# Patient Record
Sex: Female | Born: 1965 | Race: Black or African American | Hispanic: No | Marital: Single | Smoking: Never smoker
Health system: Southern US, Community
[De-identification: ages and names within clinical notes are randomized; demographics above are authoritative.]

## PROBLEM LIST (undated history)

## (undated) DIAGNOSIS — E785 Hyperlipidemia, unspecified: Secondary | ICD-10-CM

## (undated) DIAGNOSIS — G4730 Sleep apnea, unspecified: Secondary | ICD-10-CM

## (undated) DIAGNOSIS — D649 Anemia, unspecified: Secondary | ICD-10-CM

## (undated) DIAGNOSIS — I10 Essential (primary) hypertension: Secondary | ICD-10-CM

## (undated) HISTORY — DX: Hyperlipidemia, unspecified: E78.5

## (undated) HISTORY — PX: ROTATOR CUFF REPAIR: SHX139

## (undated) HISTORY — DX: Anemia, unspecified: D64.9

## (undated) HISTORY — PX: TUBAL LIGATION: SHX77

## (undated) HISTORY — DX: Sleep apnea, unspecified: G47.30

## (undated) HISTORY — DX: Essential (primary) hypertension: I10

---

## 2003-11-03 ENCOUNTER — Emergency Department (HOSPITAL_COMMUNITY): Admission: EM | Admit: 2003-11-03 | Discharge: 2003-11-03 | Payer: Self-pay | Admitting: Emergency Medicine

## 2005-03-20 ENCOUNTER — Emergency Department (HOSPITAL_COMMUNITY): Admission: EM | Admit: 2005-03-20 | Discharge: 2005-03-20 | Payer: Self-pay | Admitting: Emergency Medicine

## 2005-03-21 ENCOUNTER — Emergency Department (HOSPITAL_COMMUNITY): Admission: EM | Admit: 2005-03-21 | Discharge: 2005-03-21 | Payer: Self-pay | Admitting: Emergency Medicine

## 2005-03-25 ENCOUNTER — Emergency Department: Payer: Self-pay | Admitting: Internal Medicine

## 2005-10-05 ENCOUNTER — Emergency Department: Payer: Self-pay | Admitting: Unknown Physician Specialty

## 2007-02-17 ENCOUNTER — Emergency Department: Payer: Self-pay | Admitting: Emergency Medicine

## 2007-02-23 ENCOUNTER — Emergency Department: Payer: Self-pay | Admitting: Emergency Medicine

## 2007-02-26 ENCOUNTER — Emergency Department: Payer: Self-pay | Admitting: Emergency Medicine

## 2007-03-06 ENCOUNTER — Emergency Department: Payer: Self-pay | Admitting: Emergency Medicine

## 2007-03-08 ENCOUNTER — Emergency Department: Payer: Self-pay | Admitting: General Practice

## 2007-11-20 ENCOUNTER — Emergency Department: Payer: Self-pay | Admitting: Emergency Medicine

## 2008-07-08 ENCOUNTER — Emergency Department: Payer: Self-pay | Admitting: Emergency Medicine

## 2008-07-30 ENCOUNTER — Emergency Department: Payer: Self-pay | Admitting: Emergency Medicine

## 2008-09-03 ENCOUNTER — Emergency Department: Payer: Self-pay | Admitting: Emergency Medicine

## 2008-09-09 ENCOUNTER — Ambulatory Visit: Payer: Self-pay

## 2008-09-09 ENCOUNTER — Other Ambulatory Visit: Payer: Self-pay

## 2008-09-10 ENCOUNTER — Ambulatory Visit: Payer: Self-pay | Admitting: Cardiovascular Disease

## 2008-09-11 ENCOUNTER — Ambulatory Visit: Payer: Self-pay

## 2008-10-03 ENCOUNTER — Emergency Department: Payer: Self-pay | Admitting: Emergency Medicine

## 2008-11-09 ENCOUNTER — Emergency Department: Payer: Self-pay

## 2008-11-27 ENCOUNTER — Emergency Department: Payer: Self-pay | Admitting: Emergency Medicine

## 2008-11-29 ENCOUNTER — Emergency Department: Payer: Self-pay | Admitting: Internal Medicine

## 2009-03-22 ENCOUNTER — Emergency Department: Payer: Self-pay | Admitting: Emergency Medicine

## 2009-05-23 ENCOUNTER — Emergency Department: Payer: Self-pay | Admitting: Internal Medicine

## 2009-05-25 ENCOUNTER — Emergency Department: Payer: Self-pay | Admitting: Emergency Medicine

## 2009-08-12 ENCOUNTER — Emergency Department: Payer: Self-pay | Admitting: Emergency Medicine

## 2009-12-05 ENCOUNTER — Emergency Department (HOSPITAL_COMMUNITY): Admission: EM | Admit: 2009-12-05 | Discharge: 2009-12-05 | Payer: Self-pay | Admitting: Family Medicine

## 2010-09-11 ENCOUNTER — Emergency Department: Payer: Self-pay | Admitting: Unknown Physician Specialty

## 2010-09-16 ENCOUNTER — Emergency Department: Payer: Self-pay | Admitting: Emergency Medicine

## 2010-10-17 ENCOUNTER — Emergency Department: Payer: Self-pay | Admitting: Emergency Medicine

## 2011-01-13 ENCOUNTER — Observation Stay: Payer: Self-pay | Admitting: Specialist

## 2011-02-04 ENCOUNTER — Emergency Department: Payer: Self-pay | Admitting: Emergency Medicine

## 2011-02-26 ENCOUNTER — Emergency Department: Payer: Self-pay | Admitting: Internal Medicine

## 2011-03-21 LAB — POCT URINALYSIS DIP (DEVICE)
Glucose, UA: NEGATIVE mg/dL
Nitrite: NEGATIVE
Urobilinogen, UA: 2 mg/dL — ABNORMAL HIGH (ref 0.0–1.0)

## 2011-03-21 LAB — POCT PREGNANCY, URINE: Preg Test, Ur: NEGATIVE

## 2011-04-01 ENCOUNTER — Emergency Department (HOSPITAL_COMMUNITY)
Admission: EM | Admit: 2011-04-01 | Discharge: 2011-04-02 | Disposition: A | Discharge status: Home / Self Care | Payer: Self-pay | Attending: Emergency Medicine | Admitting: Emergency Medicine

## 2011-04-01 ENCOUNTER — Emergency Department (HOSPITAL_COMMUNITY): Payer: Self-pay

## 2011-04-01 DIAGNOSIS — E785 Hyperlipidemia, unspecified: Secondary | ICD-10-CM | POA: Insufficient documentation

## 2011-04-01 DIAGNOSIS — E669 Obesity, unspecified: Secondary | ICD-10-CM | POA: Insufficient documentation

## 2011-04-01 DIAGNOSIS — I10 Essential (primary) hypertension: Secondary | ICD-10-CM | POA: Insufficient documentation

## 2011-04-01 DIAGNOSIS — N83209 Unspecified ovarian cyst, unspecified side: Secondary | ICD-10-CM | POA: Insufficient documentation

## 2011-04-01 DIAGNOSIS — R10819 Abdominal tenderness, unspecified site: Secondary | ICD-10-CM | POA: Insufficient documentation

## 2011-04-01 DIAGNOSIS — N949 Unspecified condition associated with female genital organs and menstrual cycle: Secondary | ICD-10-CM | POA: Insufficient documentation

## 2011-04-01 DIAGNOSIS — N920 Excessive and frequent menstruation with regular cycle: Secondary | ICD-10-CM | POA: Insufficient documentation

## 2011-04-01 DIAGNOSIS — Z79899 Other long term (current) drug therapy: Secondary | ICD-10-CM | POA: Insufficient documentation

## 2011-04-01 DIAGNOSIS — R109 Unspecified abdominal pain: Secondary | ICD-10-CM | POA: Insufficient documentation

## 2011-04-01 LAB — CBC
HCT: 34 % — ABNORMAL LOW (ref 36.0–46.0)
Hemoglobin: 10.8 g/dL — ABNORMAL LOW (ref 12.0–15.0)
MCH: 22.5 pg — ABNORMAL LOW (ref 26.0–34.0)
MCHC: 31.8 g/dL (ref 30.0–36.0)
MCV: 70.8 fL — ABNORMAL LOW (ref 78.0–100.0)

## 2011-04-01 LAB — URINALYSIS, ROUTINE W REFLEX MICROSCOPIC
Nitrite: NEGATIVE
Protein, ur: NEGATIVE mg/dL
Specific Gravity, Urine: 1.033 — ABNORMAL HIGH (ref 1.005–1.030)

## 2011-04-01 LAB — DIFFERENTIAL
Basophils Absolute: 0 10*3/uL (ref 0.0–0.1)
Basophils Relative: 0 % (ref 0–1)
Eosinophils Absolute: 0.1 10*3/uL (ref 0.0–0.7)
Eosinophils Relative: 1 % (ref 0–5)
Lymphocytes Relative: 40 % (ref 12–46)
Neutro Abs: 4.8 10*3/uL (ref 1.7–7.7)

## 2011-04-01 LAB — URINE MICROSCOPIC-ADD ON

## 2011-04-01 LAB — WET PREP, GENITAL: Trich, Wet Prep: NONE SEEN

## 2011-04-19 ENCOUNTER — Ambulatory Visit: Payer: Self-pay | Admitting: Family Medicine

## 2011-05-04 ENCOUNTER — Other Ambulatory Visit: Payer: Self-pay | Admitting: Obstetrics & Gynecology

## 2011-05-04 ENCOUNTER — Encounter (INDEPENDENT_AMBULATORY_CARE_PROVIDER_SITE_OTHER): Payer: Self-pay | Admitting: Obstetrics & Gynecology

## 2011-05-04 DIAGNOSIS — N83209 Unspecified ovarian cyst, unspecified side: Secondary | ICD-10-CM

## 2011-05-04 DIAGNOSIS — N920 Excessive and frequent menstruation with regular cycle: Secondary | ICD-10-CM

## 2011-05-05 ENCOUNTER — Ambulatory Visit: Payer: Self-pay | Admitting: Family Medicine

## 2011-05-05 NOTE — Group Therapy Note (Signed)
[name], [name]                  ACCOUNT NO.:  [id number]  MEDICAL RECORD NO.:  [id number]           PATIENT TYPE:  A  LOCATION:  [facility]                   FACILITY:  [facility]  PHYSICIAN:  Scheryl Darter, MD       DATE OF BIRTH:  [birth year]  DATE OF SERVICE:  05/04/2011                                 CLINIC NOTE  The patient comes today after an episode of menorrhagia in April.  The patient is a 45 year old black female, gravida 6, para 6-0-0-6, last menstrual period began about March 18, 2011, and continued until April 07, 2011.  Bleeding became very heavy and she was soaking pads in less than an hour and having to use towels because of the bleeding.  She presents to the emergency room at [facility] on April 01, 2011.  She says prior to this she was having fairly regular periods that lasted about 3 days.  About 3 years ago, she had menorrhagia and she was seen by a gynecologist in [place].  She had a D and C performed.  Results were normal.  PAST MEDICAL HISTORY: 1. Type 2 diabetes. 2. Hypertension. 3. Hypercholesterolemia. 4. Obesity.  PAST SURGICAL HISTORY:  Tubal ligation.  SOCIAL HISTORY:  The patient is single.  She is unemployed.  She denies alcohol, tobacco or drug use.  FAMILY HISTORY:  No diabetes, heart disease or cancer in any close relatives.  REVIEW OF SYSTEMS:  The patient does have some weakness and fatigue. She gets headaches.  She occasionally has lower abdominal pain.  This occurs about 2 times a week and lasts for a couple minutes.  She also has a vaginal discharge with no odor.  She also notes frequent urination, which she thinks may be due to her fluid pill.  MEDICATIONS: 1. Metformin 500 mg p.o. b.i.d. 2. Lisinopril/hydrochlorothiazide 10/12.5 one p.o. daily. 3. Lipitor one p.o. daily.  ALLERGY:  To SULFA.  No latex allergy.  PHYSICAL EXAMINATION:  GENERAL:  The patient is in no acute distress. VITAL SIGNS:  Her weight is 237.7 pounds, height  5 feet 4 inches, blood pressure 130/84, pulse 86 and temperature 98.5.  Her affect is normal. ABDOMEN:  Obese, soft and nontender.  No mass. EXTREMITIES:  No swelling or deformity. PELVIC:  External genitalia, vagina and cervix are notable for a slight pinkish discharge with an odor.  Cervix is quite anterior.  Cervix was normal and a STD probe was obtained.  Wet prep was also done.  Uterus is retroverted and appears to be normal size.  No adnexal masses or tenderness.  Ultrasound that was performed on April 01, 2011, showed a 2.3 x 2.2 cm focus on the right ovary, question complicated cyst, endometrium was 3-mm thick with no fluid.  Left ovary was not identified.  Uterus is 9.7 cm x 5.9 cm x 6.8 cm with a probable small fibroid 1.8 x 2.0 cm.  IMPRESSION: 1. History of episode of menometrorrhagia. 2. Abdominal pain. 3. Abnormal discharge most likely representing a bacterial vaginosis.  PLAN:  The patient has close followup with her internist and will see her doctor soon.  She  states that in addition to abdominal pain, she has chronic constipation for which she takes Epson salts.  She may have 3 bowel movements without using laxative every month.  Her last bowel movement was 5 days ago.  She should discuss referral to a gastroenterologist with her internist.  Because of ovarian cyst, she could follow up with an ultrasound in about 2 months.  She should keep a menstrual calendar.  She will return in 2 months.  If she continues to have problems with heavy bleeding, she will be a candidate for a Mirena. She is also a candidate for endometrial ablation.     Scheryl Darter, MD    JA/MEDQ  D:  05/04/2011  T:  05/05/2011  Job:  [id number]

## 2011-07-06 ENCOUNTER — Ambulatory Visit (HOSPITAL_COMMUNITY)
Admission: RE | Admit: 2011-07-06 | Discharge: 2011-07-06 | Disposition: A | Discharge status: Home / Self Care | Payer: Self-pay | Source: Ambulatory Visit | Attending: Obstetrics & Gynecology | Admitting: Obstetrics & Gynecology

## 2011-07-06 DIAGNOSIS — N83209 Unspecified ovarian cyst, unspecified side: Secondary | ICD-10-CM

## 2011-07-06 DIAGNOSIS — D259 Leiomyoma of uterus, unspecified: Secondary | ICD-10-CM | POA: Insufficient documentation

## 2011-07-22 ENCOUNTER — Emergency Department: Payer: Self-pay | Admitting: *Deleted

## 2011-07-27 ENCOUNTER — Encounter: Payer: Self-pay | Admitting: *Deleted

## 2011-07-27 DIAGNOSIS — N920 Excessive and frequent menstruation with regular cycle: Secondary | ICD-10-CM

## 2011-07-27 DIAGNOSIS — E7800 Pure hypercholesterolemia, unspecified: Secondary | ICD-10-CM

## 2011-07-27 DIAGNOSIS — E669 Obesity, unspecified: Secondary | ICD-10-CM

## 2011-07-27 DIAGNOSIS — I10 Essential (primary) hypertension: Secondary | ICD-10-CM

## 2011-07-27 DIAGNOSIS — E119 Type 2 diabetes mellitus without complications: Secondary | ICD-10-CM | POA: Insufficient documentation

## 2011-07-28 ENCOUNTER — Ambulatory Visit: Payer: Self-pay | Admitting: Family Medicine

## 2011-08-16 ENCOUNTER — Emergency Department: Payer: Self-pay | Admitting: *Deleted

## 2011-11-21 ENCOUNTER — Encounter (HOSPITAL_COMMUNITY): Payer: Self-pay | Admitting: Emergency Medicine

## 2011-11-21 ENCOUNTER — Emergency Department (HOSPITAL_COMMUNITY)
Admission: EM | Admit: 2011-11-21 | Discharge: 2011-11-22 | Discharge status: Against Medical Advice | Payer: Self-pay | Attending: Emergency Medicine | Admitting: Emergency Medicine

## 2011-11-21 DIAGNOSIS — Z0389 Encounter for observation for other suspected diseases and conditions ruled out: Secondary | ICD-10-CM | POA: Insufficient documentation
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
## 2011-11-22 ENCOUNTER — Emergency Department (HOSPITAL_COMMUNITY): Payer: No Typology Code available for payment source

## 2011-11-22 ENCOUNTER — Encounter (HOSPITAL_COMMUNITY): Payer: Self-pay | Admitting: *Deleted

## 2011-11-22 ENCOUNTER — Emergency Department (HOSPITAL_COMMUNITY)
Admission: EM | Admit: 2011-11-22 | Discharge: 2011-11-23 | Disposition: A | Discharge status: Home / Self Care | Payer: No Typology Code available for payment source | Attending: Emergency Medicine | Admitting: Emergency Medicine

## 2011-11-22 DIAGNOSIS — Y92410 Unspecified street and highway as the place of occurrence of the external cause: Secondary | ICD-10-CM | POA: Insufficient documentation

## 2011-11-22 DIAGNOSIS — R0789 Other chest pain: Secondary | ICD-10-CM

## 2011-11-22 DIAGNOSIS — Z041 Encounter for examination and observation following transport accident: Secondary | ICD-10-CM

## 2011-11-22 DIAGNOSIS — R079 Chest pain, unspecified: Secondary | ICD-10-CM | POA: Insufficient documentation

## 2011-11-22 DIAGNOSIS — E119 Type 2 diabetes mellitus without complications: Secondary | ICD-10-CM | POA: Insufficient documentation

## 2011-11-22 DIAGNOSIS — Z043 Encounter for examination and observation following other accident: Secondary | ICD-10-CM | POA: Insufficient documentation

## 2011-11-22 DIAGNOSIS — R51 Headache: Secondary | ICD-10-CM | POA: Insufficient documentation

## 2011-11-22 DIAGNOSIS — M549 Dorsalgia, unspecified: Secondary | ICD-10-CM | POA: Insufficient documentation

## 2011-11-22 DIAGNOSIS — R071 Chest pain on breathing: Secondary | ICD-10-CM | POA: Insufficient documentation

## 2011-11-22 DIAGNOSIS — I10 Essential (primary) hypertension: Secondary | ICD-10-CM | POA: Insufficient documentation
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
## 2011-11-22 NOTE — ED Notes (Signed)
Called patient 3 times and NO ANSWER. 

## 2011-11-23 MED ORDER — CYCLOBENZAPRINE HCL 10 MG PO TABS
10.0000 mg | ORAL_TABLET | Freq: Once | ORAL | Status: AC
  Administered 2011-11-23: 10 mg via ORAL
  Filled 2011-11-23: qty 1

## 2011-11-23 MED ORDER — TRAMADOL HCL 50 MG PO TABS
100.0000 mg | ORAL_TABLET | Freq: Once | ORAL | Status: AC
  Administered 2011-11-23: 100 mg via ORAL
  Filled 2011-11-23: qty 2

## 2011-11-23 MED ORDER — CYCLOBENZAPRINE HCL 10 MG PO TABS: 10.0000 mg | ORAL_TABLET | Freq: Two times a day (BID) | ORAL | Status: AC | PRN

## 2011-11-23 MED ORDER — TRAMADOL HCL 50 MG PO TABS: 100.0000 mg | ORAL_TABLET | Freq: Four times a day (QID) | ORAL | Status: AC | PRN

## 2011-11-23 NOTE — ED Provider Notes (Signed)
Medical screening examination/treatment/procedure(s) were performed by non-physician practitioner and as supervising physician I was immediately available for consultation/collaboration.  Cornella Emmer, MD 11/23/11 0052 
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
## 2012-06-07 ENCOUNTER — Ambulatory Visit: Payer: Self-pay | Admitting: Family Medicine

## 2012-07-16 IMAGING — CR DG CHEST 1V PORT
1 series · 1 of 1 positions shown · non-contrast
Comparison: none

REASON FOR EXAM: chest pain
COMMENTS:

PROCEDURE:     DXR - DXR PORTABLE CHEST SINGLE VIEW  - January 13, 2011  [DATE]
RESULT:     The lung fields are clear. The heart, mediastinal and osseous
structures show no significant abnormalities.

[view not recorded]
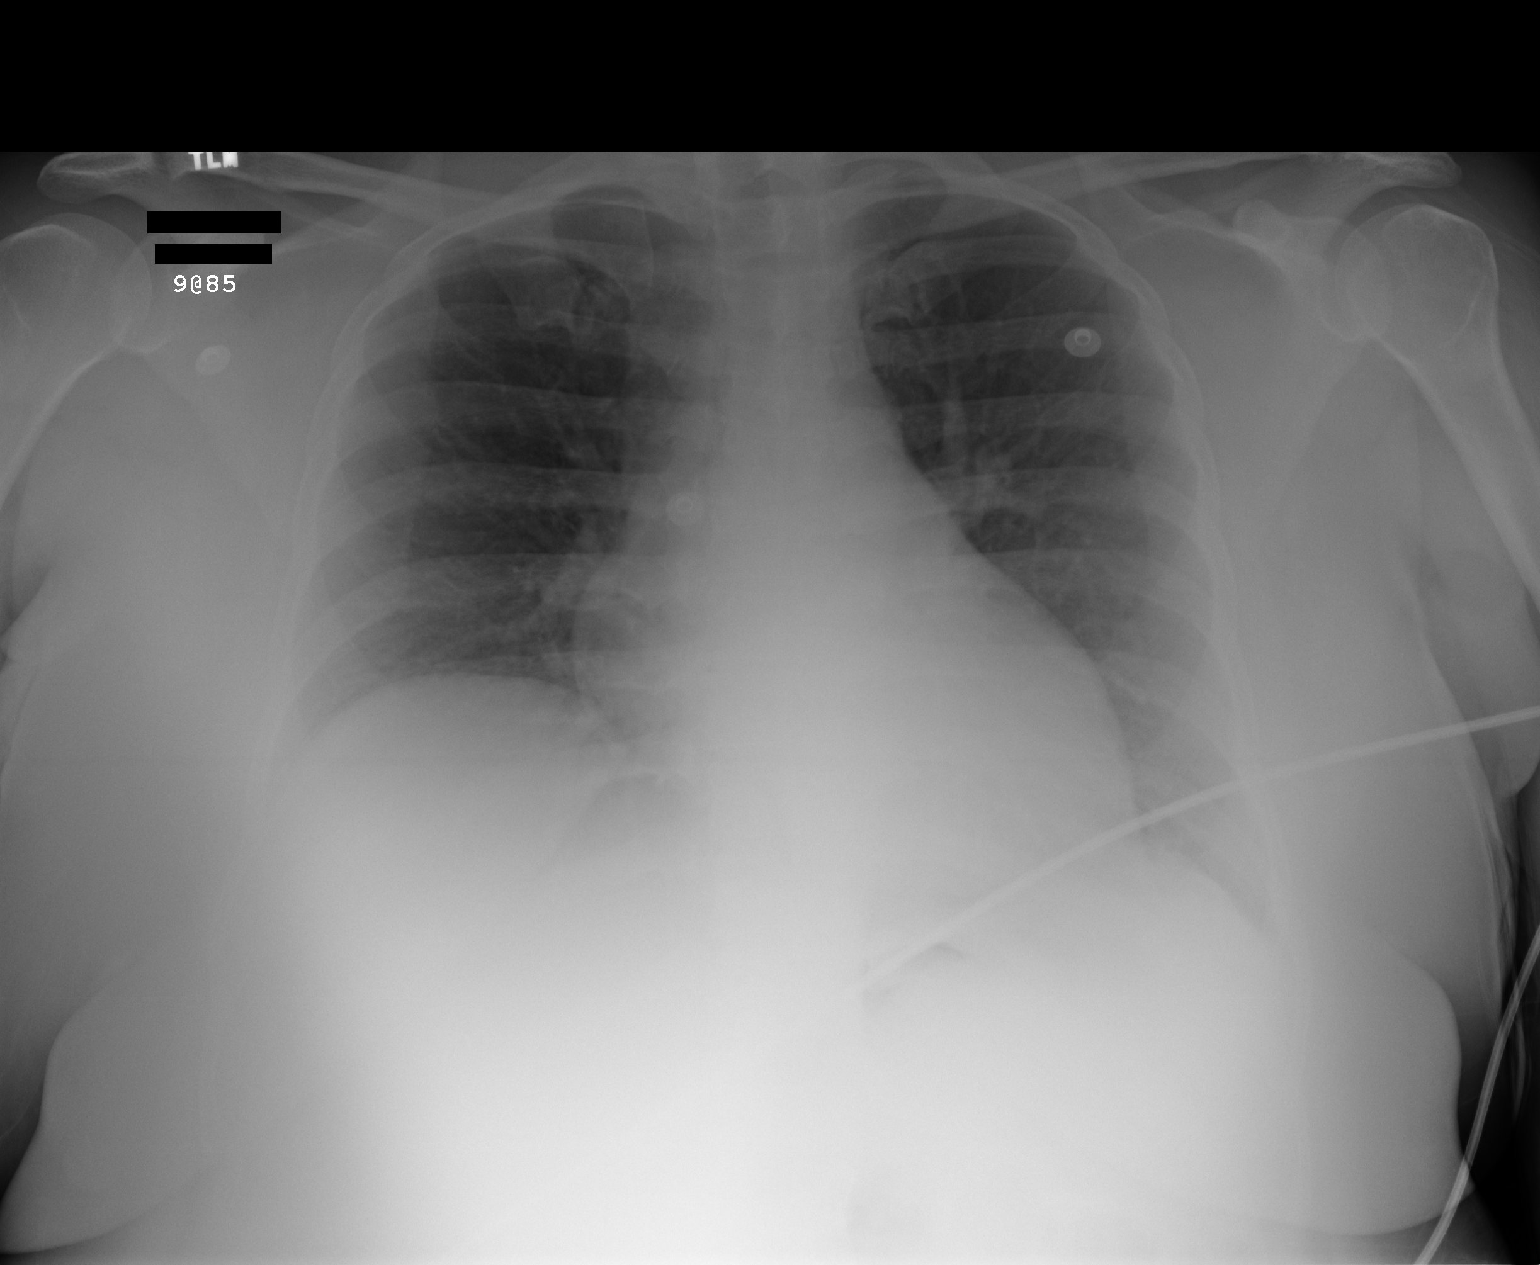

[1 of 1 positions shown; findings below may reference images not displayed]

IMPRESSION: No acute changes are identified.

## 2013-04-25 ENCOUNTER — Ambulatory Visit: Payer: Self-pay | Admitting: Obstetrics & Gynecology

## 2013-05-06 ENCOUNTER — Ambulatory Visit: Payer: Self-pay | Admitting: Obstetrics & Gynecology

## 2013-06-12 ENCOUNTER — Emergency Department: Payer: Self-pay | Admitting: Internal Medicine

## 2013-06-12 LAB — URINALYSIS, COMPLETE
Bilirubin,UR: NEGATIVE
Blood: NEGATIVE
Ketone: NEGATIVE
Nitrite: NEGATIVE
Protein: NEGATIVE
RBC,UR: 11 /HPF (ref 0–5)
Specific Gravity: 1.028 (ref 1.003–1.030)
Squamous Epithelial: 35
WBC UR: 6 /HPF (ref 0–5)

## 2013-06-13 LAB — URINE CULTURE

## 2013-12-10 ENCOUNTER — Emergency Department: Payer: Self-pay | Admitting: Emergency Medicine

## 2013-12-10 LAB — CBC
HCT: 43.3 % (ref 35.0–47.0)
Platelet: 214 10*3/uL (ref 150–440)

## 2013-12-10 LAB — COMPREHENSIVE METABOLIC PANEL
Albumin: 4.3 g/dL (ref 3.4–5.0)
Anion Gap: 5 — ABNORMAL LOW (ref 7–16)
BUN: 18 mg/dL (ref 7–18)
Bilirubin,Total: 0.7 mg/dL (ref 0.2–1.0)
Calcium, Total: 9.7 mg/dL (ref 8.5–10.1)
Co2: 28 mmol/L (ref 21–32)
EGFR (African American): >60
Osmolality: 273 (ref 275–301)
Sodium: 136 mmol/L (ref 136–145)
Total Protein: 8.3 g/dL — ABNORMAL HIGH (ref 6.4–8.2)

## 2013-12-10 LAB — TROPONIN I
Troponin-I: <0.02 ng/mL
Troponin-I: <0.02 ng/mL

## 2014-02-14 ENCOUNTER — Emergency Department: Payer: Self-pay | Admitting: Emergency Medicine

## 2014-02-16 ENCOUNTER — Emergency Department: Payer: Self-pay | Admitting: Internal Medicine

## 2014-02-16 LAB — CBC WITH DIFFERENTIAL/PLATELET
BASOS PCT: 0.5 %
Basophil #: 0 10*3/uL (ref 0.0–0.1)
EOS PCT: 3 %
Eosinophil #: 0.2 10*3/uL (ref 0.0–0.7)
HCT: 40.9 % (ref 35.0–47.0)
HGB: 13 g/dL (ref 12.0–16.0)
Lymphocyte #: 2.5 10*3/uL (ref 1.0–3.6)
Lymphocyte %: 34.2 %
MCH: 23.1 pg — ABNORMAL LOW (ref 26.0–34.0)
MCHC: 31.7 g/dL — ABNORMAL LOW (ref 32.0–36.0)
MCV: 73 fL — AB (ref 80–100)
MONO ABS: 0.6 x10 3/mm (ref 0.2–0.9)
Monocyte %: 7.8 %
NEUTROS ABS: 4 10*3/uL (ref 1.4–6.5)
Neutrophil %: 54.5 %
PLATELETS: 208 10*3/uL (ref 150–440)
RBC: 5.62 10*6/uL — ABNORMAL HIGH (ref 3.80–5.20)
RDW: 16 % — AB (ref 11.5–14.5)
WBC: 7.3 10*3/uL (ref 3.6–11.0)

## 2014-02-16 LAB — COMPREHENSIVE METABOLIC PANEL
ALBUMIN: 3.8 g/dL (ref 3.4–5.0)
AST: 13 U/L — AB (ref 15–37)
Alkaline Phosphatase: 102 U/L
Anion Gap: 3 — ABNORMAL LOW (ref 7–16)
BUN: 14 mg/dL (ref 7–18)
Bilirubin,Total: 0.7 mg/dL (ref 0.2–1.0)
Calcium, Total: 9.6 mg/dL (ref 8.5–10.1)
Chloride: 103 mmol/L (ref 98–107)
Co2: 32 mmol/L (ref 21–32)
Creatinine: 0.74 mg/dL (ref 0.60–1.30)
GLUCOSE: 91 mg/dL (ref 65–99)
Osmolality: 276 (ref 275–301)
POTASSIUM: 3.8 mmol/L (ref 3.5–5.1)
SGPT (ALT): 23 U/L (ref 12–78)
Sodium: 138 mmol/L (ref 136–145)
TOTAL PROTEIN: 8 g/dL (ref 6.4–8.2)

## 2014-10-20 ENCOUNTER — Encounter (HOSPITAL_COMMUNITY): Payer: Self-pay | Admitting: *Deleted

## 2014-10-21 ENCOUNTER — Emergency Department: Payer: Self-pay | Admitting: Emergency Medicine

## 2014-12-09 ENCOUNTER — Emergency Department: Payer: Self-pay | Admitting: Emergency Medicine

## 2014-12-22 ENCOUNTER — Emergency Department: Payer: Self-pay | Admitting: Emergency Medicine

## 2015-03-30 DIAGNOSIS — G5603 Carpal tunnel syndrome, bilateral upper limbs: Secondary | ICD-10-CM | POA: Insufficient documentation

## 2015-05-31 ENCOUNTER — Encounter: Payer: Self-pay | Admitting: *Deleted

## 2015-05-31 ENCOUNTER — Emergency Department
Admission: EM | Admit: 2015-05-31 | Discharge: 2015-05-31 | Disposition: A | Discharge status: Home / Self Care | Payer: Self-pay | Attending: Emergency Medicine | Admitting: Emergency Medicine

## 2015-05-31 DIAGNOSIS — B372 Candidiasis of skin and nail: Secondary | ICD-10-CM

## 2015-05-31 DIAGNOSIS — I10 Essential (primary) hypertension: Secondary | ICD-10-CM | POA: Insufficient documentation

## 2015-05-31 DIAGNOSIS — Z79899 Other long term (current) drug therapy: Secondary | ICD-10-CM | POA: Insufficient documentation

## 2015-05-31 DIAGNOSIS — R21 Rash and other nonspecific skin eruption: Secondary | ICD-10-CM | POA: Insufficient documentation

## 2015-05-31 DIAGNOSIS — E119 Type 2 diabetes mellitus without complications: Secondary | ICD-10-CM | POA: Insufficient documentation

## 2015-05-31 DIAGNOSIS — B379 Candidiasis, unspecified: Secondary | ICD-10-CM | POA: Insufficient documentation

## 2015-05-31 MED ORDER — PREDNISONE 10 MG PO TABS: ORAL_TABLET | ORAL | Status: DC

## 2015-05-31 MED ORDER — NYSTATIN 100000 UNIT/GM EX POWD: CUTANEOUS | Status: DC

## 2015-05-31 NOTE — ED Notes (Signed)
NAD noted at time of D/C. Pt denies questions or concerns. Pt ambulatory to the lobby at this time.  

## 2015-05-31 NOTE — ED Notes (Signed)
Pt c/o recurrent rash that is under breasts, chest, and face. Pt states she has this rash every 4-5 months. Pt was rx'd diflucan and states that does not work to clear up rash, only prednisone does. Pt is diabetic and aware of how it will affect her blood sugar.

## 2015-05-31 NOTE — ED Provider Notes (Signed)
[facility] Emergency Department Provider Note  ____________________________________________  Time seen: Approximately 7:14 AM  I have reviewed the triage vital signs and the nursing notes.   HISTORY  Chief Complaint Rash   HPI [name] is a 49 y.o. female presents to the ER for the complaint of rash. Patient has a rash present for 5 days. However states that this is the same rash that she gets intermittently every 4-5 months for approximately last 3 years. Patient states the rash starts underneath her breasts she continues to scratch and that it progresses upwards to her chest and occasionally her face. Patient states that the current rash is underneath her breast to her chest as well as her neck and face. Patient denies rash elsewhere. Denies pain. States rash is itchy. Denies facial swelling, mouth swelling oral swelling,, difficult eating. Denies fever, shortness of breath, chest pain, abdominal pain, other complaints.  Eyes changes and foods, medicines, as lotions, detergents, contacts, changes and home or other changes.  States she she was seen by her primary care physician on Friday. States that PCP felt that this was a yeast rash and was given oral Diflucan. States that she has taken or Diflucan and the rash is unresolved. Patient states that she has had this in the past and she feels that she needs prednisone.  Past Medical History  Diagnosis Date  . Anemia   . Hyperlipidemia   . Hypertension   . Diabetes mellitus     adult onset at age of 87  . Sleep apnea     Patient Active Problem List   Diagnosis Date Noted  . Menorrhagia 07/27/2011  . Diabetes mellitus type II 07/27/2011  . Obesity 07/27/2011  . Hypertension 07/27/2011  . Hypercholesteremia 07/27/2011    Past Surgical History  Procedure Laterality Date  . Tubal ligation      Current Outpatient Rx  Name  Route  Sig  Dispense  Refill  . diphenhydramine-acetaminophen (TYLENOL PM  EXTRA STRENGTH) 25-500 MG TABS   Oral   Take 1 tablet by mouth at bedtime as needed.           . fluconazole (DIFLUCAN) 100 MG tablet   Oral   Take 100 mg by mouth daily.         . hydrOXYzine (ATARAX/VISTARIL) 25 MG tablet   Oral   Take 25 mg by mouth every 6 (six) hours as needed for itching.         Marland Kitchen lisinopril-hydrochlorothiazide (PRINZIDE,ZESTORETIC) 10-12.5 MG per tablet   Oral   Take 1 tablet by mouth daily.           . metFORMIN (GLUCOPHAGE) 500 MG tablet   Oral   Take 500 mg by mouth 2 (two) times daily with a meal.           . metoprolol tartrate (LOPRESSOR) 25 MG tablet   Oral   Take 25 mg by mouth 2 (two) times daily.         Marland Kitchen atorvastatin (LIPITOR) 20 MG tablet   Oral   Take 20 mg by mouth daily.             Allergies Sulfa antibiotics  History reviewed. No pertinent family history.  Social History History  Substance Use Topics  . Smoking status: Never Smoker   . Smokeless tobacco: Never Used  . Alcohol Use: Yes     Comment: occasional    Review of Systems Constitutional: No fever/chills Eyes: No visual  changes. ENT: No sore throat. Cardiovascular: Denies chest pain. Respiratory: Denies shortness of breath. Gastrointestinal: No abdominal pain.  No nausea, no vomiting.  No diarrhea.  No constipation. Genitourinary: Negative for dysuria. Musculoskeletal: Negative for back pain. Skin: Positive for rash. Neurological: Negative for headaches, focal weakness or numbness.  10-point ROS otherwise negative.  ____________________________________________   PHYSICAL EXAM:  VITAL SIGNS: ED Triage Vitals  Enc Vitals Group     BP 05/31/15 0554 124/76 mmHg     Pulse Rate 05/31/15 0554 63     Resp 05/31/15 0554 18     Temp 05/31/15 0554 97.8 F (36.6 C)     Temp Source 05/31/15 0554 Oral     SpO2 05/31/15 0554 96 %     Weight 05/31/15 0554 238 lb (107.956 kg)     Height 05/31/15 0554 5\' 4"  (1.626 m)     Head Cir --      Peak Flow  --      Pain Score 05/31/15 0600 6     Pain Loc --      Pain Edu? --      Excl. in [place]? --     Constitutional: Alert and oriented. Well appearing and in no acute distress. Eyes: Conjunctivae are normal. PERRL. EOMI. Head: Atraumatic. Nose: No congestion/rhinnorhea. Mouth/Throat: Mucous membranes are moist.  Oropharynx non-erythematous.NO facial swelling, no oral swelling or lip swelling. Neck: No stridor.  No cervical spine tenderness to palpation. Hematological/Lymphatic/Immunilogical: No cervical lymphadenopathy. Cardiovascular: Normal rate, regular rhythm. Grossly normal heart sounds.  Good peripheral circulation. Respiratory: Normal respiratory effort.  No retractions. Lungs CTAB. Gastrointestinal: Soft and nontender. No distention. Musculoskeletal: No lower extremity tenderness nor edema.  No joint effusions. Neurologic:  Normal speech and language. No gross focal neurologic deficits are appreciated. Speech is normal. No gait instability. Skin:  Skin is warm, dry and intact. No rash noted. Except: pruritic erythematic papules with minimal erythematic base beneath bilateral breasts, with pruritic erythematic scattered papules to chest, neck and cheeks. NO surrounding erythema, fluctuance or induration. No swelling. No  Other rash. Psychiatric: Mood and affect are normal. Speech and behavior are normal.  _________________________________________   INITIAL IMPRESSION / ASSESSMENT AND PLAN / ED COURSE  Pertinent labs & imaging results that were available during my care of the patient were reviewed by me and considered in my medical decision making (see chart for details).  No acute distress. Very well-appearing patient. Resents for the complaint of 5 days of rash. Reports history of similar in past and patient reports unknown why. Reports not being relieved with oral Diflucan at home. Rash underneath breasts with appearance of yeast rash. We'll treat with topical nystatin powder, oral  prednisone. Patient reports that she is currently taking Diflucan as well as hydroxyzine as needed for itching. No signs of infection. Patient follow-up with her primary care physician. Discussed monitor blood sugars at home due to prednisone. She verbalized understanding. Discussed follow-up and return parameters. Patient agreed to plan. ____________________________________________   FINAL CLINICAL IMPRESSION(S) / ED DIAGNOSES  Final diagnoses:  Rash  Yeast infection of the skin beneath breasts      Marylene Land, NP 05/31/15 [place], NP 05/31/15 [id number]  Lavonia Drafts, MD 05/31/15 0740

## 2015-05-31 NOTE — Discharge Instructions (Signed)
Take medication as prescribed. Monitor blood sugars at home as discussed. Drink plenty of water. Avoid scratching. Keep skin dry underneath breasts.  Follow-up the primary care physician next week. Follow up with dermatology as needed for continued complaints.  Return to the ER for new or worsening concerns.  Candida Infection A Candida infection (also called yeast, fungus, and Monilia infection) is an overgrowth of yeast that can occur anywhere on the body. A yeast infection commonly occurs in warm, moist body areas. Usually, the infection remains localized but can spread to become a systemic infection. A yeast infection may be a sign of a more severe disease such as diabetes, leukemia, or AIDS. A yeast infection can occur in both men and women. In women, Candida vaginitis is a vaginal infection. It is one of the most common causes of vaginitis. Men usually do not have symptoms or know they have an infection until other problems develop. Men may find out they have a yeast infection because their sex partner has a yeast infection. Uncircumcised men are more likely to get a yeast infection than circumcised men. This is because the uncircumcised glans is not exposed to air and does not remain as dry as that of a circumcised glans. Older adults may develop yeast infections around dentures. CAUSES  Women  Antibiotics.  Steroid medication taken for a long time.  Being overweight (obese).  Diabetes.  Poor immune condition.  Certain serious medical conditions.  Immune suppressive medications for organ transplant patients.  Chemotherapy.  Pregnancy.  Menstruation.  Stress and fatigue.  Intravenous drug use.  Oral contraceptives.  Wearing tight-fitting clothes in the crotch area.  Catching it from a sex partner who has a yeast infection.  Spermicide.  Intravenous, urinary, or other catheters. Men  Catching it from a sex partner who has a yeast infection.  Having oral or anal  sex with a person who has the infection.  Spermicide.  Diabetes.  Antibiotics.  Poor immune system.  Medications that suppress the immune system.  Intravenous drug use.  Intravenous, urinary, or other catheters. SYMPTOMS  Women  Thick, white vaginal discharge.  Vaginal itching.  Redness and swelling in and around the vagina.  Irritation of the lips of the vagina and perineum.  Blisters on the vaginal lips and perineum.  Painful sexual intercourse.  Low blood sugar (hypoglycemia).  Painful urination.  Bladder infections.  Intestinal problems such as constipation, indigestion, bad breath, bloating, increase in gas, diarrhea, or loose stools. Men  Men may develop intestinal problems such as constipation, indigestion, bad breath, bloating, increase in gas, diarrhea, or loose stools.  Dry, cracked skin on the penis with itching or discomfort.  Jock itch.  Dry, flaky skin.  Athlete's foot.  Hypoglycemia. DIAGNOSIS  Women  A history and an exam are performed.  The discharge may be examined under a microscope.  A culture may be taken of the discharge. Men  A history and an exam are performed.  Any discharge from the penis or areas of cracked skin will be looked at under the microscope and cultured.  Stool samples may be cultured. TREATMENT  Women  Vaginal antifungal suppositories and creams.  Medicated creams to decrease irritation and itching on the outside of the vagina.  Warm compresses to the perineal area to decrease swelling and discomfort.  Oral antifungal medications.  Medicated vaginal suppositories or cream for repeated or recurrent infections.  Wash and dry the irritation areas before applying the cream.  Eating yogurt with Lactobacillus may  help with prevention and treatment.  Sometimes painting the vagina with gentian violet solution may help if creams and suppositories do not work. Men  Antifungal creams and oral antifungal  medications.  Sometimes treatment must continue for 30 days after the symptoms go away to prevent recurrence. HOME CARE INSTRUCTIONS  Women  Use cotton underwear and avoid tight-fitting clothing.  Avoid colored, scented toilet paper and deodorant tampons or pads.  Do not douche.  Keep your diabetes under control.  Finish all the prescribed medications.  Keep your skin clean and dry.  Consume milk or yogurt with Lactobacillus-active culture regularly. If you get frequent yeast infections and think that is what the infection is, there are over-the-counter medications that you can get. If the infection does not show healing in 3 days, talk to your caregiver.  Tell your sex partner you have a yeast infection. Your partner may need treatment also, especially if your infection does not clear up or recurs. Men  Keep your skin clean and dry.  Keep your diabetes under control.  Finish all prescribed medications.  Tell your sex partner that you have a yeast infection so he or she can be treated if necessary. SEEK MEDICAL CARE IF:   Your symptoms do not clear up or worsen in one week after treatment.  You have an oral temperature above 102 F (38.9 C).  You have trouble swallowing or eating for a prolonged time.  You develop blisters on and around your vagina.  You develop vaginal bleeding and it is not your menstrual period.  You develop abdominal pain.  You develop intestinal problems as mentioned above.  You get weak or light-headed.  You have painful or increased urination.  You have pain during sexual intercourse. MAKE SURE YOU:   Understand these instructions.  Will watch your condition.  Will get help right away if you are not doing well or get worse. Document Released: 01/12/2005 Document Revised: 04/21/2014 Document Reviewed: 04/26/2010 [facility] Patient Information 2015 [place], [place]. This information is not intended to replace advice given to you by your  health care provider. Make sure you discuss any questions you have with your health care provider.  Rash A rash is a change in the color or feel of your skin. There are many different types of rashes. You may have other problems along with your rash. HOME CARE  Avoid the thing that caused your rash.  Do not scratch your rash.  You may take cools baths to help stop itching.  Only take medicines as told by your doctor.  Keep all doctor visits as told. GET HELP RIGHT AWAY IF:   Your pain, puffiness (swelling), or redness gets worse.  You have a fever.  You have new or severe problems.  You have body aches, watery poop (diarrhea), or you throw up (vomit).  Your rash is not better after 3 days. MAKE SURE YOU:   Understand these instructions.  Will watch your condition.  Will get help right away if you are not doing well or get worse. Document Released: 05/23/2008 Document Revised: 02/27/2012 Document Reviewed: 09/19/2011 [facility] Patient Information 2015 [place], [place]. This information is not intended to replace advice given to you by your health care provider. Make sure you discuss any questions you have with your health care provider.

## 2016-03-11 ENCOUNTER — Emergency Department
Admission: EM | Admit: 2016-03-11 | Discharge: 2016-03-11 | Disposition: A | Discharge status: Home / Self Care | Payer: Self-pay | Attending: Emergency Medicine | Admitting: Emergency Medicine

## 2016-03-11 ENCOUNTER — Encounter: Payer: Self-pay | Admitting: Emergency Medicine

## 2016-03-11 DIAGNOSIS — E785 Hyperlipidemia, unspecified: Secondary | ICD-10-CM | POA: Insufficient documentation

## 2016-03-11 DIAGNOSIS — E669 Obesity, unspecified: Secondary | ICD-10-CM | POA: Insufficient documentation

## 2016-03-11 DIAGNOSIS — E782 Mixed hyperlipidemia: Secondary | ICD-10-CM | POA: Insufficient documentation

## 2016-03-11 DIAGNOSIS — Z79899 Other long term (current) drug therapy: Secondary | ICD-10-CM | POA: Insufficient documentation

## 2016-03-11 DIAGNOSIS — E119 Type 2 diabetes mellitus without complications: Secondary | ICD-10-CM | POA: Insufficient documentation

## 2016-03-11 DIAGNOSIS — M5441 Lumbago with sciatica, right side: Secondary | ICD-10-CM | POA: Insufficient documentation

## 2016-03-11 DIAGNOSIS — Z794 Long term (current) use of insulin: Secondary | ICD-10-CM | POA: Insufficient documentation

## 2016-03-11 DIAGNOSIS — I10 Essential (primary) hypertension: Secondary | ICD-10-CM | POA: Insufficient documentation

## 2016-03-11 MED ORDER — KETOROLAC TROMETHAMINE 30 MG/ML IJ SOLN
30.0000 mg | Freq: Once | INTRAMUSCULAR | Status: AC
  Administered 2016-03-11: 30 mg via INTRAMUSCULAR

## 2016-03-11 MED ORDER — NAPROXEN 500 MG PO TABS: 500.0000 mg | ORAL_TABLET | Freq: Two times a day (BID) | ORAL | Status: DC

## 2016-03-11 MED ORDER — CYCLOBENZAPRINE HCL 5 MG PO TABS: 5.0000 mg | ORAL_TABLET | Freq: Three times a day (TID) | ORAL | Status: DC | PRN

## 2016-03-11 MED ORDER — KETOROLAC TROMETHAMINE 30 MG/ML IJ SOLN
INTRAMUSCULAR | Status: AC
  Filled 2016-03-11: qty 1

## 2016-03-11 NOTE — ED Notes (Signed)
Having right lower back pain which radiates into leg

## 2016-03-11 NOTE — ED Provider Notes (Signed)
[facility] Emergency Department Provider Note  ____________________________________________  Time seen: Approximately 5:37 PM  I have reviewed the triage vital signs and the nursing notes.   HISTORY  Chief Complaint Back Pain    HPI [name] is a 50 y.o. female , NAD, presents emergency department with right lower back pain that can radiate into the right buttock and leg. Pain started around 3 AM this morning and has increased throughout the day today. Has taken over-the-counter ibuprofen without alleviation of symptoms. Denies any traumas, injuries, falls. No numbness, weakness, tingling. Denies any changes in urinary or bowel habits. Has not had any dysuria or hematuria. Denies abdominal pain, nausea, vomiting. Denies saddle paresthesias or loss of bowel or bladder control. Has not noted any rashes.   Past Medical History  Diagnosis Date  . Anemia   . Hyperlipidemia   . Hypertension   . Diabetes mellitus     adult onset at age of 13  . Sleep apnea     Patient Active Problem List   Diagnosis Date Noted  . Menorrhagia 07/27/2011  . Diabetes mellitus type II 07/27/2011  . Obesity 07/27/2011  . Hypertension 07/27/2011  . Hypercholesteremia 07/27/2011    Past Surgical History  Procedure Laterality Date  . Tubal ligation      Current Outpatient Rx  Name  Route  Sig  Dispense  Refill  . atorvastatin (LIPITOR) 20 MG tablet   Oral   Take 20 mg by mouth daily.           . cyclobenzaprine (FLEXERIL) 5 MG tablet   Oral   Take 1 tablet (5 mg total) by mouth every 8 (eight) hours as needed for muscle spasms.   21 tablet   0   . diphenhydramine-acetaminophen (TYLENOL PM EXTRA STRENGTH) 25-500 MG TABS   Oral   Take 1 tablet by mouth at bedtime as needed.           . hydrOXYzine (ATARAX/VISTARIL) 25 MG tablet   Oral   Take 25 mg by mouth every 6 (six) hours as needed for itching.         Marland Kitchen lisinopril-hydrochlorothiazide  (PRINZIDE,ZESTORETIC) 10-12.5 MG per tablet   Oral   Take 1 tablet by mouth daily.           . metFORMIN (GLUCOPHAGE) 500 MG tablet   Oral   Take 500 mg by mouth 2 (two) times daily with a meal.           . metoprolol tartrate (LOPRESSOR) 25 MG tablet   Oral   Take 25 mg by mouth 2 (two) times daily.         . naproxen (NAPROSYN) 500 MG tablet   Oral   Take 1 tablet (500 mg total) by mouth 2 (two) times daily with a meal.   14 tablet   0   . nystatin (MYCOSTATIN/NYSTOP) 100000 UNIT/GM POWD      Apply beneath breasts twice a day for 14 days   1 Bottle   0   . predniSONE (DELTASONE) 10 MG tablet      Start 60 mg po day one, then 50 mg po day two, taper by 10 mg daily until complete.   21 tablet   0     Allergies Sulfa antibiotics  No family history on file.  Social History Social History  Substance Use Topics  . Smoking status: Never Smoker   . Smokeless tobacco: Never Used  . Alcohol Use:  Yes     Comment: occasional     Review of Systems  Constitutional: No fever/chills, fatigue Cardiovascular: No chest pain. Respiratory:  No shortness of breath. No wheezing.  Gastrointestinal: No abdominal pain.  No nausea, vomiting.  No diarrhea.  Genitourinary: Negative for dysuria, hematuria.  Musculoskeletal: Positive for back pain.  Skin: Negative for rash. Neurological: Negative for headaches, focal weakness or numbness. No saddle paresthesias. 10-point ROS otherwise negative.  ____________________________________________   PHYSICAL EXAM:  VITAL SIGNS: ED Triage Vitals  Enc Vitals Group     BP 03/11/16 1729 125/75 mmHg     Pulse Rate 03/11/16 1729 92     Resp 03/11/16 1729 20     Temp 03/11/16 1729 98.2 F (36.8 C)     Temp Source 03/11/16 1729 Oral     SpO2 03/11/16 1729 97 %     Weight --      Height 03/11/16 1729 5\' 4"  (1.626 m)     Head Cir --      Peak Flow --      Pain Score 03/11/16 1730 10     Pain Loc --      Pain Edu? --      Excl.  in [place]? --     Constitutional: Alert and oriented. Well appearing and in no acute distress. Eyes: Conjunctivae are normal. Head: Atraumatic. Neck: Supple with full range of motion. Cardiovascular: Good peripheral circulation. Respiratory: Normal respiratory effort without tachypnea or retractions.  Musculoskeletal: Mild right sided SI joint tenderness that causes pain radiating into the right buttock and hip. Full range of motion of lumbar spine without discomfort or pain. No central lumbar or sacral spinal tenderness to palpation. No muscle spasms appreciated. Mild right lateral lumbar paraspinal muscular pain to palpation. No lower extremity tenderness nor edema.  No joint effusions. Neurologic:  Normal speech and language. No gross focal neurologic deficits are appreciated.  Skin:  Skin is warm, dry and intact. No rash, skin sores noted. Psychiatric: Mood and affect are normal. Speech and behavior are normal. Patient exhibits appropriate insight and judgement.   ____________________________________________   LABS  None  ____________________________________________  EKG  None ____________________________________________  RADIOLOGY  None ____________________________________________    PROCEDURES  Procedure(s) performed: None    Medications  ketorolac (TORADOL) 30 MG/ML injection 30 mg (30 mg Intramuscular Given 03/11/16 1746)     ____________________________________________   INITIAL IMPRESSION / ASSESSMENT AND PLAN / ED COURSE  Patient's diagnosis is consistent with right-sided lower back pain with sciatica. Patient will be discharged home with prescriptions for naproxen and Flexeril to take as directed. Patient is to follow up with her primary care provider or [place] [facility] if symptoms persist past this treatment course. Patient is given ED precautions to return to the ED for any worsening or new symptoms.     ____________________________________________  FINAL CLINICAL IMPRESSION(S) / ED DIAGNOSES  Final diagnoses:  Right-sided low back pain with right-sided sciatica      NEW MEDICATIONS STARTED DURING THIS VISIT:  Discharge Medication List as of 03/11/2016  5:45 PM    START taking these medications   Details  cyclobenzaprine (FLEXERIL) 5 MG tablet Take 1 tablet (5 mg total) by mouth every 8 (eight) hours as needed for muscle spasms., Starting 03/11/2016, Until Discontinued, Print    naproxen (NAPROSYN) 500 MG tablet Take 1 tablet (500 mg total) by mouth 2 (two) times daily with a meal., Starting 03/11/2016, Until Discontinued, Print  Braxton Feathers, PA-C 03/11/16 1818  Earleen Newport, MD 03/11/16 [phone]

## 2016-03-11 NOTE — Discharge Instructions (Signed)
Back Exercises If you have pain in your back, do these exercises 2-3 times each day or as told by your doctor. When the pain goes away, do the exercises once each day, but repeat the steps more times for each exercise (do more repetitions). If you do not have pain in your back, do these exercises once each day or as told by your doctor. EXERCISES Single Knee to Chest Do these steps 3-5 times in a row for each leg:  Lie on your back on a firm bed or the floor with your legs stretched out.  Bring one knee to your chest.  Hold your knee to your chest by grabbing your knee or thigh.  Pull on your knee until you feel a gentle stretch in your lower back.  Keep doing the stretch for 10-30 seconds.  Slowly let go of your leg and straighten it. Pelvic Tilt Do these steps 5-10 times in a row:  Lie on your back on a firm bed or the floor with your legs stretched out.  Bend your knees so they point up to the ceiling. Your feet should be flat on the floor.  Tighten your lower belly (abdomen) muscles to press your lower back against the floor. This will make your tailbone point up to the ceiling instead of pointing down to your feet or the floor.  Stay in this position for 5-10 seconds while you gently tighten your muscles and breathe evenly. Cat-Cow Do these steps until your lower back bends more easily:  Get on your hands and knees on a firm surface. Keep your hands under your shoulders, and keep your knees under your hips. You may put padding under your knees.  Let your head hang down, and make your tailbone point down to the floor so your lower back is round like the back of a cat.  Stay in this position for 5 seconds.  Slowly lift your head and make your tailbone point up to the ceiling so your back hangs low (sags) like the back of a cow.  Stay in this position for 5 seconds. Press-Ups Do these steps 5-10 times in a row: 1. Lie on your belly (face-down) on the floor. 2. Place your  hands near your head, about shoulder-width apart. 3. While you keep your back relaxed and keep your hips on the floor, slowly straighten your arms to raise the top half of your body and lift your shoulders. Do not use your back muscles. To make yourself more comfortable, you may change where you place your hands. 4. Stay in this position for 5 seconds. 5. Slowly return to lying flat on the floor. Bridges Do these steps 10 times in a row: 1. Lie on your back on a firm surface. 2. Bend your knees so they point up to the ceiling. Your feet should be flat on the floor. 3. Tighten your butt muscles and lift your butt off of the floor until your waist is almost as high as your knees. If you do not feel the muscles working in your butt and the back of your thighs, slide your feet 1-2 inches farther away from your butt. 4. Stay in this position for 3-5 seconds. 5. Slowly lower your butt to the floor, and let your butt muscles relax. If this exercise is too easy, try doing it with your arms crossed over your chest. Belly Crunches Do these steps 5-10 times in a row: 1. Lie on your back on a firm bed  or the floor with your legs stretched out. 2. Bend your knees so they point up to the ceiling. Your feet should be flat on the floor. 3. Cross your arms over your chest. 4. Tip your chin a little bit toward your chest but do not bend your neck. 5. Tighten your belly muscles and slowly raise your chest just enough to lift your shoulder blades a tiny bit off of the floor. 6. Slowly lower your chest and your head to the floor. Back Lifts Do these steps 5-10 times in a row: 1. Lie on your belly (face-down) with your arms at your sides, and rest your forehead on the floor. 2. Tighten the muscles in your legs and your butt. 3. Slowly lift your chest off of the floor while you keep your hips on the floor. Keep the back of your head in line with the curve in your back. Look at the floor while you do this. 4. Stay  in this position for 3-5 seconds. 5. Slowly lower your chest and your face to the floor. GET HELP IF:  Your back pain gets a lot worse when you do an exercise.  Your back pain does not lessen 2 hours after you exercise. If you have any of these problems, stop doing the exercises. Do not do them again unless your doctor says it is okay. GET HELP RIGHT AWAY IF:  You have sudden, very bad back pain. If this happens, stop doing the exercises. Do not do them again unless your doctor says it is okay.   This information is not intended to replace advice given to you by your health care provider. Make sure you discuss any questions you have with your health care provider.   Document Released: 01/07/2011 Document Revised: 08/26/2015 Document Reviewed: 01/29/2015 Elsevier Interactive Patient Education 2016 [employer]  Sciatica Sciatica is pain, weakness, numbness, or tingling along your sciatic nerve. The nerve starts in the lower back and runs down the back of each leg. Nerve damage or certain conditions pinch or put pressure on the sciatic nerve. This causes the pain, weakness, and other discomforts of sciatica. HOME CARE   Only take medicine as told by your doctor.  Apply ice to the affected area for 20 minutes. Do this 3-4 times a day for the first 48-72 hours. Then try heat in the same way.  Exercise, stretch, or do your usual activities if these do not make your pain worse.  Go to physical therapy as told by your doctor.  Keep all doctor visits as told.  Do not wear high heels or shoes that are not supportive.  Get a firm mattress if your mattress is too soft to lessen pain and discomfort. GET HELP RIGHT AWAY IF:   You cannot control when you poop (bowel movement) or pee (urinate).  You have more weakness in your lower back, lower belly (pelvis), butt (buttocks), or legs.  You have redness or puffiness (swelling) of your back.  You have a burning feeling when you pee.  You  have pain that gets worse when you lie down.  You have pain that wakes you from your sleep.  Your pain is worse than past pain.  Your pain lasts longer than 4 weeks.  You are suddenly losing weight without reason. MAKE SURE YOU:   Understand these instructions.  Will watch this condition.  Will get help right away if you are not doing well or get worse.   This information is not  intended to replace advice given to you by your health care provider. Make sure you discuss any questions you have with your health care provider.   Document Released: 09/13/2008 Document Revised: 08/26/2015 Document Reviewed: 04/15/2012 Elsevier Interactive Patient Education [employer].

## 2016-08-09 ENCOUNTER — Emergency Department
Admission: EM | Admit: 2016-08-09 | Discharge: 2016-08-09 | Disposition: A | Discharge status: Home / Self Care | Payer: Self-pay | Attending: Emergency Medicine | Admitting: Emergency Medicine

## 2016-08-09 DIAGNOSIS — L089 Local infection of the skin and subcutaneous tissue, unspecified: Secondary | ICD-10-CM

## 2016-08-09 DIAGNOSIS — S61216A Laceration without foreign body of right little finger without damage to nail, initial encounter: Secondary | ICD-10-CM | POA: Insufficient documentation

## 2016-08-09 DIAGNOSIS — I10 Essential (primary) hypertension: Secondary | ICD-10-CM | POA: Insufficient documentation

## 2016-08-09 DIAGNOSIS — T148XXA Other injury of unspecified body region, initial encounter: Secondary | ICD-10-CM

## 2016-08-09 DIAGNOSIS — Y9389 Activity, other specified: Secondary | ICD-10-CM | POA: Insufficient documentation

## 2016-08-09 DIAGNOSIS — Y999 Unspecified external cause status: Secondary | ICD-10-CM | POA: Insufficient documentation

## 2016-08-09 DIAGNOSIS — Y92009 Unspecified place in unspecified non-institutional (private) residence as the place of occurrence of the external cause: Secondary | ICD-10-CM | POA: Insufficient documentation

## 2016-08-09 DIAGNOSIS — Z7984 Long term (current) use of oral hypoglycemic drugs: Secondary | ICD-10-CM | POA: Insufficient documentation

## 2016-08-09 DIAGNOSIS — E119 Type 2 diabetes mellitus without complications: Secondary | ICD-10-CM | POA: Insufficient documentation

## 2016-08-09 DIAGNOSIS — W228XXA Striking against or struck by other objects, initial encounter: Secondary | ICD-10-CM | POA: Insufficient documentation

## 2016-08-09 DIAGNOSIS — Z79899 Other long term (current) drug therapy: Secondary | ICD-10-CM | POA: Insufficient documentation

## 2016-08-09 MED ORDER — CLINDAMYCIN HCL 150 MG PO CAPS
300.0000 mg | ORAL_CAPSULE | Freq: Once | ORAL | Status: AC
  Administered 2016-08-09: 300 mg via ORAL
  Filled 2016-08-09: qty 2

## 2016-08-09 MED ORDER — CLINDAMYCIN HCL 300 MG PO CAPS: 300.0000 mg | ORAL_CAPSULE | Freq: Three times a day (TID) | ORAL | 0 refills | Status: AC

## 2016-08-09 NOTE — ED Triage Notes (Signed)
Patient ambulatory to triage with steady gait, without difficulty or distress noted; pt reports cleaning on Sunday underneath refrigerator and injured right 5th digit; blistering abrasion noted

## 2016-08-09 NOTE — ED Provider Notes (Signed)
[facility] Emergency Department Provider Note   ____________________________________________   First MD Initiated Contact with Patient 08/09/16 [phone]     (approximate)  I have reviewed the triage vital signs and the nursing notes.   HISTORY  Chief Complaint Laceration    HPI [name] is a 50 y.o. female who comes into the hospital today with a possible laceration infection. The patient reports that she was cleaning up her house on Sunday and she put her hand in the refrigerator. She reports that when she pulled her hand out she developed a scratch on herright pinky. She reports that she went to work that day where she works at [employer] and she had been using multiple chemicals. She reports that the area has become red with some drainage and soreness. She reports it does hurt a little bit to move. The patient denies any fevers and reports that this has happened in the past. She rates her pain a 4 out of 10 in intensity. The patient does have a history of diabetes and thinks that steroids as the only thing that may help with this. She is here for treatment of this infection.   Past Medical History:  Diagnosis Date  . Anemia   . Diabetes mellitus    adult onset at age of 71  . Hyperlipidemia   . Hypertension   . Sleep apnea     Patient Active Problem List   Diagnosis Date Noted  . Menorrhagia 07/27/2011  . Diabetes mellitus type II 07/27/2011  . Obesity 07/27/2011  . Hypertension 07/27/2011  . Hypercholesteremia 07/27/2011    Past Surgical History:  Procedure Laterality Date  . TUBAL LIGATION      Prior to Admission medications   Medication Sig Start Date End Date Taking? Authorizing Provider  atorvastatin (LIPITOR) 20 MG tablet Take 20 mg by mouth daily.      Historical Provider, MD  clindamycin (CLEOCIN) 300 MG capsule Take 1 capsule (300 mg total) by mouth 3 (three) times daily. 08/09/16 08/19/16  Loney Hering, MD  cyclobenzaprine  (FLEXERIL) 5 MG tablet Take 1 tablet (5 mg total) by mouth every 8 (eight) hours as needed for muscle spasms. 03/11/16   Jami L Hagler, PA-C  diphenhydramine-acetaminophen (TYLENOL PM EXTRA STRENGTH) 25-500 MG TABS Take 1 tablet by mouth at bedtime as needed.      Historical Provider, MD  hydrOXYzine (ATARAX/VISTARIL) 25 MG tablet Take 25 mg by mouth every 6 (six) hours as needed for itching.    Historical Provider, MD  lisinopril-hydrochlorothiazide (PRINZIDE,ZESTORETIC) 10-12.5 MG per tablet Take 1 tablet by mouth daily.      Historical Provider, MD  metFORMIN (GLUCOPHAGE) 500 MG tablet Take 500 mg by mouth 2 (two) times daily with a meal.      Historical Provider, MD  metoprolol tartrate (LOPRESSOR) 25 MG tablet Take 25 mg by mouth 2 (two) times daily.    Historical Provider, MD  naproxen (NAPROSYN) 500 MG tablet Take 1 tablet (500 mg total) by mouth 2 (two) times daily with a meal. 03/11/16   Jami L Hagler, PA-C  nystatin (MYCOSTATIN/NYSTOP) 100000 UNIT/GM POWD Apply beneath breasts twice a day for 14 days 05/31/15   Marylene Land, NP  predniSONE (DELTASONE) 10 MG tablet Start 60 mg po day one, then 50 mg po day two, taper by 10 mg daily until complete. 05/31/15   Marylene Land, NP    Allergies Sulfa antibiotics  No family history on file.  Social History Social History  Substance Use Topics  . Smoking status: Never Smoker  . Smokeless tobacco: Never Used  . Alcohol use Yes     Comment: occasional    Review of Systems Constitutional: No fever/chills Eyes: No visual changes. ENT: No sore throat. Cardiovascular: Denies chest pain. Respiratory: Denies shortness of breath. Gastrointestinal: No abdominal pain.  No nausea, no vomiting.  No diarrhea.  No constipation. Genitourinary: Negative for dysuria. Musculoskeletal: Negative for back pain. Skin: Redness over red pinky finger, laceration Neurological: Negative for headaches, focal weakness or numbness.  10-point ROS otherwise  negative.  ____________________________________________   PHYSICAL EXAM:  VITAL SIGNS: ED Triage Vitals  Enc Vitals Group     BP 08/09/16 0445 112/78     Pulse Rate 08/09/16 0445 85     Resp 08/09/16 0445 18     Temp 08/09/16 0445 98 F (36.7 C)     Temp Source 08/09/16 0445 Oral     SpO2 08/09/16 0445 99 %     Weight 08/09/16 0444 275 lb (124.7 kg)     Height 08/09/16 0444 5\' 4"  (1.626 m)     Head Circumference --      Peak Flow --      Pain Score 08/09/16 0443 4     Pain Loc --      Pain Edu? --      Excl. in [place]? --     Constitutional: Alert and oriented. Well appearing and in mild distress. Eyes: Conjunctivae are normal. PERRL. EOMI. Head: Atraumatic. Nose: No congestion/rhinnorhea. Mouth/Throat: Mucous membranes are moist.   Cardiovascular: Normal rate, regular rhythm. Grossly normal heart sounds.   Respiratory: Normal respiratory effort.  No retractions. Lungs CTAB. Gastrointestinal: Soft and nontender. No distention. Positive bowel sounds Musculoskeletal: No lower extremity tenderness nor edema.   Neurologic:  Normal speech and language.  Skin:  Skin is warm, dry healing laceration to dorsum of right fifth digit with some areas of swelling and erythema surrounding the laceration. Extends from base of right phalanx to PIP joint. Psychiatric: Mood and affect are normal.   ____________________________________________   LABS (all labs ordered are listed, but only abnormal results are displayed)  Labs Reviewed - No data to display ____________________________________________  EKG  none ____________________________________________  RADIOLOGY  none ____________________________________________   PROCEDURES  Procedure(s) performed: None  Procedures  Critical Care performed: No  ____________________________________________   INITIAL IMPRESSION / ASSESSMENT AND PLAN / ED COURSE  Pertinent labs & imaging results that were available during my care of  the patient were reviewed by me and considered in my medical decision making (see chart for details).  This is a 51 year old female who comes into the hospital today with a laceration to her finger that appears as though is becoming infected. I will give her a dose of clindamycin and I will discharge her to follow-up with her primary care physician.  Clinical Course     ____________________________________________   FINAL CLINICAL IMPRESSION(S) / ED DIAGNOSES  Final diagnoses:  Finger infection  Infected laceration      NEW MEDICATIONS STARTED DURING THIS VISIT:  New Prescriptions   CLINDAMYCIN (CLEOCIN) 300 MG CAPSULE    Take 1 capsule (300 mg total) by mouth 3 (three) times daily.     Note:  This document was prepared using Dragon voice recognition software and may include unintentional dictation errors.    Loney Hering, MD 08/09/16 [phone]

## 2017-04-22 ENCOUNTER — Emergency Department: Payer: 59

## 2017-04-22 ENCOUNTER — Emergency Department
Admission: EM | Admit: 2017-04-22 | Discharge: 2017-04-22 | Disposition: A | Discharge status: Home / Self Care | Payer: 59 | Attending: Emergency Medicine | Admitting: Emergency Medicine

## 2017-04-22 ENCOUNTER — Encounter: Payer: Self-pay | Admitting: Emergency Medicine

## 2017-04-22 DIAGNOSIS — Z79899 Other long term (current) drug therapy: Secondary | ICD-10-CM | POA: Diagnosis not present

## 2017-04-22 DIAGNOSIS — E119 Type 2 diabetes mellitus without complications: Secondary | ICD-10-CM | POA: Diagnosis not present

## 2017-04-22 DIAGNOSIS — I10 Essential (primary) hypertension: Secondary | ICD-10-CM | POA: Diagnosis not present

## 2017-04-22 DIAGNOSIS — R0789 Other chest pain: Secondary | ICD-10-CM | POA: Insufficient documentation

## 2017-04-22 DIAGNOSIS — R079 Chest pain, unspecified: Secondary | ICD-10-CM

## 2017-04-22 LAB — TROPONIN I: Troponin I: <0.03 ng/mL (ref <0.03)

## 2017-04-22 LAB — CBC
HEMATOCRIT: 39.4 % (ref 35.0–47.0)
HEMOGLOBIN: 12.3 g/dL (ref 12.0–16.0)
MCH: 22.3 pg — AB (ref 26.0–34.0)
MCHC: 31.3 g/dL — AB (ref 32.0–36.0)
MCV: 71.3 fL — AB (ref 80.0–100.0)
Platelets: 217 10*3/uL (ref 150–440)
RBC: 5.53 MIL/uL — ABNORMAL HIGH (ref 3.80–5.20)
RDW: 15.5 % — AB (ref 11.5–14.5)
WBC: 7.2 10*3/uL (ref 3.6–11.0)

## 2017-04-22 LAB — BASIC METABOLIC PANEL
Anion gap: 6 (ref 5–15)
BUN: 21 mg/dL — AB (ref 6–20)
CHLORIDE: 102 mmol/L (ref 101–111)
CO2: 30 mmol/L (ref 22–32)
Calcium: 8.9 mg/dL (ref 8.9–10.3)
Creatinine, Ser: 0.75 mg/dL (ref 0.44–1.00)
GFR calc Af Amer: >60 mL/min (ref >60)
GLUCOSE: 120 mg/dL — AB (ref 65–99)
POTASSIUM: 3.5 mmol/L (ref 3.5–5.1)
Sodium: 138 mmol/L (ref 135–145)

## 2017-04-22 MED ORDER — PANTOPRAZOLE SODIUM 40 MG PO TBEC: 40.0000 mg | DELAYED_RELEASE_TABLET | Freq: Every day | ORAL | 1 refills | Status: DC

## 2017-04-22 MED ORDER — GI COCKTAIL ~~LOC~~
30.0000 mL | Freq: Once | ORAL | Status: AC
  Administered 2017-04-22: 30 mL via ORAL
  Filled 2017-04-22: qty 30

## 2017-04-22 NOTE — ED Notes (Signed)
Pt verbalized understanding of discharge instructions. NAD at this time. 

## 2017-04-22 NOTE — Discharge Instructions (Signed)
You have been seen in the emergency department today for chest pain. Your workup has shown normal results and your discomfort is possibly related to gastric reflux. As we discussed please follow-up with your primary care physician in the next 1-2 days for recheck. Return to the emergency department for any further chest pain, trouble breathing, or any other symptom personally concerning to yourself. Please call the number provided for cardiology to arrange a follow up appointment as soon as possible.

## 2017-04-22 NOTE — ED Triage Notes (Signed)
Pt ambulatory to triage in NAD, reports central chest pain radiating to back, worse with deep inspiration and swallowing.  Pt unable to describe pain.  Respirations equal and unlabored, skin warm and dry.

## 2017-04-22 NOTE — ED Provider Notes (Signed)
[facility] Emergency Department Provider Note  Time seen: 7:58 AM  I have reviewed the triage vital signs and the nursing notes.   HISTORY  Chief Complaint Chest Pain    HPI [name] is a 51 y.o. female with a past medical history diabetes, hypertension, hyperlipidemia, anemia, presents to the emergency department for chest discomfort. According to the patient for the past 2 weeks she has been feeling a slight discomfort in the center of her chest especially with swallowing. Patient states occasionally she feels a pain in her back as well made worse with movement. Denies any cough, congestion. Denies any leg pain or swelling. Denies any pleuritic nature to the chest pain. Denies any fever. Currently the patient states very mild discomfort in her back which is worse with movement.States the pain has been ongoing for at least 2 weeks but it is very intermittent. Describes the pain as mild.  Past Medical History:  Diagnosis Date  . Anemia   . Diabetes mellitus    adult onset at age of 36  . Hyperlipidemia   . Hypertension   . Sleep apnea     Patient Active Problem List   Diagnosis Date Noted  . Menorrhagia 07/27/2011  . Diabetes mellitus type II 07/27/2011  . Obesity 07/27/2011  . Hypertension 07/27/2011  . Hypercholesteremia 07/27/2011    Past Surgical History:  Procedure Laterality Date  . TUBAL LIGATION      Prior to Admission medications   Medication Sig Start Date End Date Taking? Authorizing Provider  atorvastatin (LIPITOR) 20 MG tablet Take 20 mg by mouth daily.      [provider]  cyclobenzaprine (FLEXERIL) 5 MG tablet Take 1 tablet (5 mg total) by mouth every 8 (eight) hours as needed for muscle spasms. 03/11/16   Hagler, Jami L, PA-C  diphenhydramine-acetaminophen (TYLENOL PM EXTRA STRENGTH) 25-500 MG TABS Take 1 tablet by mouth at bedtime as needed.      [provider]  hydrOXYzine (ATARAX/VISTARIL) 25 MG tablet  Take 25 mg by mouth every 6 (six) hours as needed for itching.    [provider]  lisinopril-hydrochlorothiazide (PRINZIDE,ZESTORETIC) 10-12.5 MG per tablet Take 1 tablet by mouth daily.      [provider]  metFORMIN (GLUCOPHAGE) 500 MG tablet Take 500 mg by mouth 2 (two) times daily with a meal.      [provider]  metoprolol tartrate (LOPRESSOR) 25 MG tablet Take 25 mg by mouth 2 (two) times daily.    [provider]  naproxen (NAPROSYN) 500 MG tablet Take 1 tablet (500 mg total) by mouth 2 (two) times daily with a meal. 03/11/16   Hagler, Jami L, PA-C  nystatin (MYCOSTATIN/NYSTOP) 100000 UNIT/GM POWD Apply beneath breasts twice a day for 14 days 05/31/15   Marylene Land, NP  predniSONE (DELTASONE) 10 MG tablet Start 60 mg po day one, then 50 mg po day two, taper by 10 mg daily until complete. 05/31/15   Marylene Land, NP    Allergies  Allergen Reactions  . Sulfa Antibiotics Hives    History reviewed. No pertinent family history.  Social History Social History  Substance Use Topics  . Smoking status: Never Smoker  . Smokeless tobacco: Never Used  . Alcohol use Yes     Comment: occasional    Review of Systems Constitutional: Negative for fever. Eyes: Negative for visual changes. ENT: Negative for congestion Cardiovascular: Intermittent central chest discomfort Respiratory: Negative for shortness of breath. Gastrointestinal:  Negative for abdominal pain, vomiting and diarrhea. Genitourinary: Negative for dysuria. Musculoskeletal: Negative for leg pain or swelling. Mild intermittent central back discomfort. Skin: Negative for rash. Neurological: Negative for headache All other ROS negative  ____________________________________________   PHYSICAL EXAM:  VITAL SIGNS: ED Triage Vitals  Enc Vitals Group     BP --      Pulse --      Resp --      Temp --      Temp src --      SpO2 --      Weight 04/22/17 0614 232 lb (105.2 kg)      Height 04/22/17 0614 5\' 4"  (1.626 m)     Head Circumference --      Peak Flow --      Pain Score 04/22/17 0613 5     Pain Loc --      Pain Edu? --      Excl. in [place]? --     Constitutional: Alert and oriented. Well appearing and in no distress. Eyes: Normal exam ENT   Head: Normocephalic and atraumatic.   Mouth/Throat: Mucous membranes are moist. Cardiovascular: Normal rate, regular rhythm. No murmurs, rubs, or gallops. Respiratory: Normal respiratory effort without tachypnea nor retractions. Breath sounds are clear  Gastrointestinal: Soft and nontender. No distention. Musculoskeletal: Nontender with normal range of motion in all extremities. No lower extremity edema or tenderness. Neurologic:  Normal speech and language. No gross focal neurologic deficits Skin:  Skin is warm, dry and intact.  Psychiatric: Mood and affect are normal.   ____________________________________________    EKG  EKG reviewed and interpreted by myself shows normal sinus rhythm at 75 bpm, narrow QRS, normal axis, normal intervals, no concerning ST changes.  ____________________________________________    RADIOLOGY  Chest x-ray negative  ____________________________________________   INITIAL IMPRESSION / ASSESSMENT AND PLAN / ED COURSE  Pertinent labs & imaging results that were available during my care of the patient were reviewed by me and considered in my medical decision making (see chart for details).  Patient presents to the emergency department for intermittent central chest discomfort which occurs when she swallows, but only sometimes. She also states intermittent pain in the center of her back which is worse with movement. Denies any pleuritic pain. Denies any leg pain or swelling. Patient states currently she has very mild dull discomfort to the center of her chest which has been present intermittently since yesterday. Patient's labs are reassuringly normal including a negative  troponin. Chest x-ray is negative. EKG is reassuring. We will treat with a GI cocktail and continue to monitor in the emergency department. Very low suspicion for ACS. Normal vitals no pleuritic nature to the chest pain with no leg pain or swelling no suspicion for PE.  Patient states complete resolution of discomfort after GI cocktail. Highly suspect gastric cause of the patient's discomfort. I discussed with the patient follow-up with a cardiologist for a stress test she states it is been several years since her last stress test but states it was normal at that time. Patient will call cardiology to arrange a stress test. Patient has a primary care appointment on the 11th. I discussed my normal chest pain return precautions. We will discharge with Protonix.  ____________________________________________   FINAL CLINICAL IMPRESSION(S) / ED DIAGNOSES  Chest pain    Harvest Dark, MD 04/22/17 [phone]

## 2017-05-10 ENCOUNTER — Other Ambulatory Visit: Payer: Self-pay | Admitting: Primary Care

## 2017-05-10 DIAGNOSIS — Z0000 Encounter for general adult medical examination without abnormal findings: Secondary | ICD-10-CM

## 2017-07-20 ENCOUNTER — Encounter: Payer: Self-pay | Admitting: *Deleted

## 2017-07-20 ENCOUNTER — Emergency Department: Payer: 59

## 2017-07-20 ENCOUNTER — Emergency Department
Admission: EM | Admit: 2017-07-20 | Discharge: 2017-07-21 | Disposition: A | Discharge status: Home / Self Care | Payer: 59 | Attending: Emergency Medicine | Admitting: Emergency Medicine

## 2017-07-20 DIAGNOSIS — Z79899 Other long term (current) drug therapy: Secondary | ICD-10-CM | POA: Diagnosis not present

## 2017-07-20 DIAGNOSIS — M19011 Primary osteoarthritis, right shoulder: Secondary | ICD-10-CM | POA: Insufficient documentation

## 2017-07-20 DIAGNOSIS — I10 Essential (primary) hypertension: Secondary | ICD-10-CM | POA: Diagnosis not present

## 2017-07-20 DIAGNOSIS — E119 Type 2 diabetes mellitus without complications: Secondary | ICD-10-CM | POA: Diagnosis not present

## 2017-07-20 DIAGNOSIS — M25511 Pain in right shoulder: Secondary | ICD-10-CM

## 2017-07-20 NOTE — ED Notes (Signed)
Pt c/o 10/10 right shoulder pain; has full movement of the joint.

## 2017-07-20 NOTE — ED Provider Notes (Signed)
[facility] Emergency Department Provider Note ____________________________________________  Time seen: 2318  I have reviewed the triage vital signs and the nursing notes.  HISTORY  Chief Complaint  Shoulder Pain  HPI [name] is a 51 y.o. right-handed female presents to the ED for evaluation of a two-week complaining of intermittent right shoulder pain. Patient denies any injury, accident, trauma, fall. She also denies any previous shoulder injury or ongoing chronic pain. She's been taking over-the-counter Tylenol and Aleve without significant benefit. She reports pain that increased with range of motion. Symptoms increased sharply over the last 48 hours, and she notes that it is worse with cold weather and cold exposure. She denies any distal paresthesias, grip changes, weakness, shortness of breath, or chest pain.  Past Medical History:  Diagnosis Date  . Anemia   . Diabetes mellitus    adult onset at age of 89  . Hyperlipidemia   . Hypertension   . Sleep apnea     Patient Active Problem List   Diagnosis Date Noted  . Menorrhagia 07/27/2011  . Diabetes mellitus type II 07/27/2011  . Obesity 07/27/2011  . Hypertension 07/27/2011  . Hypercholesteremia 07/27/2011    Past Surgical History:  Procedure Laterality Date  . TUBAL LIGATION      Prior to Admission medications   Medication Sig Start Date End Date Taking? Authorizing Provider  cyclobenzaprine (FLEXERIL) 5 MG tablet Take 1 tablet (5 mg total) by mouth every 8 (eight) hours as needed for muscle spasms. Patient not taking: Reported on 04/22/2017 03/11/16   Hagler, Jami L, PA-C  lisinopril-hydrochlorothiazide (PRINZIDE,ZESTORETIC) 20-25 MG tablet Take 1 tablet by mouth daily.      [provider]  naproxen (NAPROSYN) 500 MG tablet Take 1 tablet (500 mg total) by mouth 2 (two) times daily with a meal. Patient not taking: Reported on 04/22/2017 03/11/16   Hagler, Jami L, PA-C  nystatin  (MYCOSTATIN/NYSTOP) 100000 UNIT/GM POWD Apply beneath breasts twice a day for 14 days Patient not taking: Reported on 04/22/2017 05/31/15   Marylene Land, NP  pantoprazole (PROTONIX) 40 MG tablet Take 1 tablet (40 mg total) by mouth daily. 04/22/17 04/22/18  Harvest Dark, MD  predniSONE (DELTASONE) 10 MG tablet Start 60 mg po day one, then 50 mg po day two, taper by 10 mg daily until complete. Patient not taking: Reported on 04/22/2017 05/31/15   Marylene Land, NP  ranitidine (ZANTAC) 150 MG tablet Take 150 mg by mouth 2 (two) times daily.    [provider]    Allergies Sulfa antibiotics  No family history on file.  Social History Social History  Substance Use Topics  . Smoking status: Never Smoker  . Smokeless tobacco: Never Used  . Alcohol use Yes     Comment: occasional    Review of Systems  Constitutional: Negative for fever. Cardiovascular: Negative for chest pain. Respiratory: Negative for shortness of breath. Musculoskeletal: Negative for back pain. Right shoulder pain as above Skin: Negative for rash. Neurological: Negative for headaches, focal weakness or numbness. ____________________________________________  PHYSICAL EXAM:  VITAL SIGNS: ED Triage Vitals  Enc Vitals Group     BP 07/20/17 2240 (!) 151/81     Pulse Rate 07/20/17 2240 78     Resp 07/20/17 2240 18     Temp 07/20/17 2240 98.2 F (36.8 C)     Temp Source 07/20/17 2240 Oral     SpO2 07/20/17 2240 100 %     Weight 07/20/17 2240 260 lb (  117.9 kg)     Height 07/20/17 2240 5\' 4"  (1.626 m)     Head Circumference --      Peak Flow --      Pain Score 07/20/17 2243 9     Pain Loc --      Pain Edu? --      Excl. in [place]? --     Constitutional: Alert and oriented. Well appearing and in no distress. Head: Normocephalic and atraumatic. Cardiovascular: Normal rate, regular rhythm. Normal distal pulses. Respiratory: Normal respiratory effort. No wheezes/rales/rhonchi. Musculoskeletal: Right  shoulder without any obvious dislocation, deformity, sulcus sign. Patient with limited range of motion with external rotation. She also has normal abduction to 90. Normal rotator cuff testing on exam. Nontender with normal range of motion in all extremities.  Neurologic:  Normal gross sensation. Normal intrinsic and opposition testing. Normal speech and language. No gross focal neurologic deficits are appreciated. Skin:  Skin is warm, dry and intact. No rash noted. ____________________________________________   RADIOLOGY  Right Shoulder  IMPRESSION: Mild to moderate AC joint degenerative changes and minimal glenohumeral degenerative changes.  I, Verdis Koval, Dannielle Karvonen, personally viewed and evaluated these images (plain radiographs) as part of my medical decision making, as well as reviewing the written report by the radiologist. ____________________________________________  PROCEDURES  Diclofenac 75 mg PO ____________________________________________  INITIAL IMPRESSION / ASSESSMENT AND PLAN / ED COURSE  Patient with right shoulder pain secondary to underlying before meals arthritis and mild DJD. She discharged with a prescription for diclofenac, Flexeril, and Ultram No. 10. She should follow-up with primary care provider or ortho for ongoing symptom management. ____________________________________________  FINAL CLINICAL IMPRESSION(S) / ED DIAGNOSES  Final diagnoses:  Acute pain of right shoulder  Primary osteoarthritis of right shoulder      Shimon Trowbridge, Dannielle Karvonen, PA-C 07/21/17 0023    Delman Kitten, MD 07/22/17 [phone]

## 2017-07-20 NOTE — ED Triage Notes (Signed)
Pt has right shoulder pain for 2 weeks.  No known injury.  Taking otc meds without pain relief.  Pt alert

## 2017-07-21 MED ORDER — CYCLOBENZAPRINE HCL 5 MG PO TABS: 5.0000 mg | ORAL_TABLET | Freq: Three times a day (TID) | ORAL | 0 refills | Status: DC | PRN

## 2017-07-21 MED ORDER — DICLOFENAC SODIUM 50 MG PO TBEC: 50.0000 mg | DELAYED_RELEASE_TABLET | Freq: Two times a day (BID) | ORAL | 1 refills | Status: AC

## 2017-07-21 MED ORDER — DICLOFENAC SODIUM 75 MG PO TBEC
75.0000 mg | DELAYED_RELEASE_TABLET | Freq: Once | ORAL | Status: AC
  Administered 2017-07-21: 75 mg via ORAL
  Filled 2017-07-21: qty 1

## 2017-07-21 MED ORDER — TRAMADOL HCL 50 MG PO TABS: 50.0000 mg | ORAL_TABLET | Freq: Four times a day (QID) | ORAL | 0 refills | Status: AC | PRN

## 2017-07-21 NOTE — ED Notes (Signed)

## 2017-07-21 NOTE — Discharge Instructions (Signed)
Your exam and x-ray are consistent with mild arthritis in the right shoulder. Take the prescription meds as directed. Take the muscle relaxant primarily at night. Follow-up with your provider or Dr. Roland Rack for continued symptoms.

## 2017-11-13 ENCOUNTER — Ambulatory Visit
Admission: RE | Admit: 2017-11-13 | Discharge: 2017-11-13 | Disposition: A | Discharge status: Home / Self Care | Payer: 59 | Source: Ambulatory Visit | Attending: Primary Care | Admitting: Primary Care

## 2017-11-13 DIAGNOSIS — Z0000 Encounter for general adult medical examination without abnormal findings: Secondary | ICD-10-CM

## 2017-11-13 DIAGNOSIS — Z1231 Encounter for screening mammogram for malignant neoplasm of breast: Secondary | ICD-10-CM | POA: Diagnosis present

## 2018-03-28 ENCOUNTER — Other Ambulatory Visit: Payer: Self-pay

## 2018-03-28 ENCOUNTER — Emergency Department
Admission: EM | Admit: 2018-03-28 | Discharge: 2018-03-28 | Disposition: A | Discharge status: Home / Self Care | Payer: 59 | Attending: Emergency Medicine | Admitting: Emergency Medicine

## 2018-03-28 ENCOUNTER — Emergency Department: Payer: 59

## 2018-03-28 DIAGNOSIS — R1031 Right lower quadrant pain: Secondary | ICD-10-CM | POA: Diagnosis present

## 2018-03-28 DIAGNOSIS — I10 Essential (primary) hypertension: Secondary | ICD-10-CM | POA: Insufficient documentation

## 2018-03-28 DIAGNOSIS — E119 Type 2 diabetes mellitus without complications: Secondary | ICD-10-CM | POA: Insufficient documentation

## 2018-03-28 DIAGNOSIS — R109 Unspecified abdominal pain: Secondary | ICD-10-CM

## 2018-03-28 LAB — CBC
HEMATOCRIT: 39.1 % (ref 35.0–47.0)
HEMOGLOBIN: 12.5 g/dL (ref 12.0–16.0)
MCH: 22.8 pg — ABNORMAL LOW (ref 26.0–34.0)
MCHC: 32 g/dL (ref 32.0–36.0)
MCV: 71.2 fL — AB (ref 80.0–100.0)
Platelets: 216 10*3/uL (ref 150–440)
RBC: 5.5 MIL/uL — ABNORMAL HIGH (ref 3.80–5.20)
RDW: 14.6 % — ABNORMAL HIGH (ref 11.5–14.5)
WBC: 7.3 10*3/uL (ref 3.6–11.0)

## 2018-03-28 LAB — COMPREHENSIVE METABOLIC PANEL
ALBUMIN: 4 g/dL (ref 3.5–5.0)
ALK PHOS: 78 U/L (ref 38–126)
ALT: 18 U/L (ref 14–54)
AST: 21 U/L (ref 15–41)
Anion gap: 7 (ref 5–15)
BILIRUBIN TOTAL: 1 mg/dL (ref 0.3–1.2)
BUN: 26 mg/dL — AB (ref 6–20)
CALCIUM: 9 mg/dL (ref 8.9–10.3)
CO2: 28 mmol/L (ref 22–32)
Chloride: 104 mmol/L (ref 101–111)
Creatinine, Ser: 0.74 mg/dL (ref 0.44–1.00)
GFR calc Af Amer: >60 mL/min (ref >60)
GFR calc non Af Amer: >60 mL/min (ref >60)
GLUCOSE: 110 mg/dL — AB (ref 65–99)
Potassium: 3.3 mmol/L — ABNORMAL LOW (ref 3.5–5.1)
Sodium: 139 mmol/L (ref 135–145)
TOTAL PROTEIN: 7.8 g/dL (ref 6.5–8.1)

## 2018-03-28 LAB — URINALYSIS, COMPLETE (UACMP) WITH MICROSCOPIC
Bacteria, UA: NONE SEEN
Bilirubin Urine: NEGATIVE
Glucose, UA: NEGATIVE mg/dL
KETONES UR: NEGATIVE mg/dL
Leukocytes, UA: NEGATIVE
Nitrite: NEGATIVE
PH: 5 (ref 5.0–8.0)
Protein, ur: NEGATIVE mg/dL
SPECIFIC GRAVITY, URINE: 1.028 (ref 1.005–1.030)

## 2018-03-28 MED ORDER — DIAZEPAM 5 MG PO TABS: 5.0000 mg | ORAL_TABLET | Freq: Three times a day (TID) | ORAL | 0 refills | Status: DC | PRN

## 2018-03-28 MED ORDER — IBUPROFEN 800 MG PO TABS: 800.0000 mg | ORAL_TABLET | Freq: Three times a day (TID) | ORAL | 0 refills | Status: DC | PRN

## 2018-03-28 NOTE — ED Notes (Signed)
Patient transported to CT 

## 2018-03-28 NOTE — ED Provider Notes (Signed)
[facility] Emergency Department Provider Note       Time seen: ----------------------------------------- 7:30 AM on 03/28/2018 -----------------------------------------   I have reviewed the triage vital signs and the nursing notes.  HISTORY   Chief Complaint Flank Pain    HPI [name] is a 52 y.o. female with a history of anemia, diabetes, hyperlipidemia, hypertension and sleep apnea who presents to the ED for right flank pain since Sunday.  Patient states she had similar symptoms a month ago and was told she had a kidney stone.  She denies any fevers, chills, chest pain, shortness of breath, vomiting or diarrhea.  She denies any dysuria or hematuria.  Past Medical History:  Diagnosis Date  . Anemia   . Diabetes mellitus    adult onset at age of 37  . Hyperlipidemia   . Hypertension   . Sleep apnea     Patient Active Problem List   Diagnosis Date Noted  . Menorrhagia 07/27/2011  . Diabetes mellitus type II 07/27/2011  . Obesity 07/27/2011  . Hypertension 07/27/2011  . Hypercholesteremia 07/27/2011    Past Surgical History:  Procedure Laterality Date  . TUBAL LIGATION      Allergies Sulfa antibiotics  Social History Social History   Tobacco Use  . Smoking status: Never Smoker  . Smokeless tobacco: Never Used  Substance Use Topics  . Alcohol use: Yes    Comment: occasional  . Drug use: No   Review of Systems Constitutional: Negative for fever. Cardiovascular: Negative for chest pain. Respiratory: Negative for shortness of breath. Gastrointestinal: Positive for flank pain Genitourinary: Negative for dysuria. Musculoskeletal: Negative for back pain. Skin: Negative for rash. Neurological: Negative for headaches, focal weakness or numbness.  All systems negative/normal/unremarkable except as stated in the HPI  ____________________________________________   PHYSICAL EXAM:  VITAL SIGNS: ED Triage Vitals [03/28/18 0559]   Enc Vitals Group     BP 130/69     Pulse Rate 92     Resp 20     Temp 98.2 F (36.8 C)     Temp Source Oral     SpO2 96 %     Weight 230 lb (104.3 kg)     Height 5\' 4"  (1.626 m)     Head Circumference      Peak Flow      Pain Score 7     Pain Loc      Pain Edu?      Excl. in [place]?    Constitutional: Alert and oriented. Well appearing and in no distress. Eyes: Conjunctivae are normal. Normal extraocular movements. ENT   Head: Normocephalic and atraumatic.   Nose: No congestion/rhinnorhea.   Mouth/Throat: Mucous membranes are moist.   Neck: No stridor. Cardiovascular: Normal rate, regular rhythm. No murmurs, rubs, or gallops. Respiratory: Normal respiratory effort without tachypnea nor retractions. Breath sounds are clear and equal bilaterally. No wheezes/rales/rhonchi. Gastrointestinal: Right flank tenderness, no rebound or guarding.  Normal bowel sounds. Musculoskeletal: Nontender with normal range of motion in extremities. No lower extremity tenderness nor edema. Neurologic:  Normal speech and language. No gross focal neurologic deficits are appreciated.  Skin:  Skin is warm, dry and intact. No rash noted. Psychiatric: Mood and affect are normal. Speech and behavior are normal.  ____________________________________________  ED COURSE:  As part of my medical decision making, I reviewed the following data within the [place] History obtained from family if available, nursing notes, old chart and ekg, as well as  notes from prior ED visits. Patient presented for flank pain, we will assess with labs and imaging as indicated at this time.   Procedures ____________________________________________   LABS (pertinent positives/negatives)  Labs Reviewed  CBC - Abnormal; Notable for the following components:      Result Value   RBC 5.50 (*)    MCV 71.2 (*)    MCH 22.8 (*)    RDW 14.6 (*)    All other components within normal limits  COMPREHENSIVE  METABOLIC PANEL - Abnormal; Notable for the following components:   Potassium 3.3 (*)    Glucose, Bld 110 (*)    BUN 26 (*)    All other components within normal limits  URINALYSIS, COMPLETE (UACMP) WITH MICROSCOPIC - Abnormal; Notable for the following components:   Color, Urine YELLOW (*)    APPearance HAZY (*)    Hgb urine dipstick SMALL (*)    Squamous Epithelial / LPF 0-5 (*)    All other components within normal limits    RADIOLOGY Images were viewed by me CT renal protocol IMPRESSION: 1. No urinary tract calculi or hydronephrosis identified. 2. Hepatic steatosis. 3. 5 mm right lower lobe lung nodule. No follow-up needed if patient is low-risk. Non-contrast chest CT can be considered in 12 months if patient is high-risk. This recommendation follows the consensus statement: Guidelines for Management of Incidental Pulmonary Nodules Detected on CT Images: From the Fleischner Society 2017; Radiology 2017; 284:228-243. 4.  Aortic Atherosclerosis (ICD10-I70.0).  ____________________________________________  DIFFERENTIAL DIAGNOSIS   Muscle strain, radicular pain, renal colic, UTI, pyelonephritis  FINAL ASSESSMENT AND PLAN  Flank pain   Plan: The patient had presented for right flank pain of uncertain etiology. Patient's labs were reassuring. Patient's imaging was also reassuring.  Patient will be discharged with Motrin and Valium, she is stable for outpatient follow-up.   Laurence Aly, MD   Note: This note was generated in part or whole with voice recognition software. Voice recognition is usually quite accurate but there are transcription errors that can and very often do occur. I apologize for any typographical errors that were not detected and corrected.     Earleen Newport, MD 03/28/18 [phone]

## 2018-03-28 NOTE — ED Triage Notes (Signed)
Pt in with co right flank pain since Sunday, has similar symptoms a month ago and told she had kidney stones. Pt denies any dysuria at this time.

## 2018-11-07 ENCOUNTER — Encounter: Payer: Self-pay | Admitting: Emergency Medicine

## 2018-11-07 ENCOUNTER — Other Ambulatory Visit: Payer: Self-pay

## 2018-11-07 ENCOUNTER — Emergency Department
Admission: EM | Admit: 2018-11-07 | Discharge: 2018-11-07 | Disposition: A | Discharge status: Home / Self Care | Payer: 59 | Attending: Emergency Medicine | Admitting: Emergency Medicine

## 2018-11-07 DIAGNOSIS — E119 Type 2 diabetes mellitus without complications: Secondary | ICD-10-CM | POA: Diagnosis not present

## 2018-11-07 DIAGNOSIS — N6489 Other specified disorders of breast: Secondary | ICD-10-CM | POA: Insufficient documentation

## 2018-11-07 DIAGNOSIS — I10 Essential (primary) hypertension: Secondary | ICD-10-CM | POA: Insufficient documentation

## 2018-11-07 DIAGNOSIS — Z79899 Other long term (current) drug therapy: Secondary | ICD-10-CM | POA: Insufficient documentation

## 2018-11-07 DIAGNOSIS — S2000XA Contusion of breast, unspecified breast, initial encounter: Secondary | ICD-10-CM

## 2018-11-07 LAB — CBC WITH DIFFERENTIAL/PLATELET
ABS IMMATURE GRANULOCYTES: 0.01 10*3/uL (ref 0.00–0.07)
BASOS PCT: 0 %
Basophils Absolute: 0 10*3/uL (ref 0.0–0.1)
Eosinophils Absolute: 0.1 10*3/uL (ref 0.0–0.5)
Eosinophils Relative: 1 %
HCT: 38.5 % (ref 36.0–46.0)
HEMOGLOBIN: 11.5 g/dL — AB (ref 12.0–15.0)
IMMATURE GRANULOCYTES: 0 %
LYMPHS PCT: 39 %
Lymphs Abs: 2.4 10*3/uL (ref 0.7–4.0)
MCH: 22.4 pg — AB (ref 26.0–34.0)
MCHC: 29.9 g/dL — ABNORMAL LOW (ref 30.0–36.0)
MCV: 74.9 fL — AB (ref 80.0–100.0)
MONOS PCT: 8 %
Monocytes Absolute: 0.5 10*3/uL (ref 0.1–1.0)
NEUTROS ABS: 3.1 10*3/uL (ref 1.7–7.7)
NEUTROS PCT: 52 %
Platelets: 225 10*3/uL (ref 150–400)
RBC: 5.14 MIL/uL — ABNORMAL HIGH (ref 3.87–5.11)
RDW: 15.9 % — ABNORMAL HIGH (ref 11.5–15.5)
WBC: 6.1 10*3/uL (ref 4.0–10.5)
nRBC: 0 % (ref 0.0–0.2)

## 2018-11-07 NOTE — ED Triage Notes (Signed)
Pt presents with bruise to her right breast discovered 2 days ago. She denies any known injury or trauma and states it hurts "a little every now and then." Pt alert & oriented with nad noted.

## 2018-11-07 NOTE — Discharge Instructions (Signed)
Follow-up with your primary care provider if any continued problems.  Take Tylenol if needed for pain.  You may apply ice to the bruise however it appears that it is dissolving on its own and should not be visible in the next 3 to 4 days.

## 2018-11-07 NOTE — ED Notes (Signed)
See triage note  Presents with a bruised area to lateral right breast  States area is note painful  And denies any trauma to area

## 2018-11-07 NOTE — ED Provider Notes (Signed)
[facility] Emergency Department Provider Note  ____________________________________________   First MD Initiated Contact with Patient 11/07/18 0800     (approximate)  I have reviewed the triage vital signs and the nursing notes.   HISTORY  Chief Complaint bruise   HPI [name] is a 52 y.o. female presents to the ED with complaint of right breast bruising that was discovered 2 days ago.  Patient states that she is unaware of any injury in that area.  She states it "hurts a little now and then".  She denies any fever, chills, nausea or vomiting.  She denies any discharge from her nipple.  She has not taken any over-the-counter medication and denies daily use of aspirin.  She rates her pain as 3 out of 10.   Past Medical History:  Diagnosis Date  . Anemia   . Diabetes mellitus    adult onset at age of 73  . Hyperlipidemia   . Hypertension   . Sleep apnea     Patient Active Problem List   Diagnosis Date Noted  . Menorrhagia 07/27/2011  . Diabetes mellitus type II 07/27/2011  . Obesity 07/27/2011  . Hypertension 07/27/2011  . Hypercholesteremia 07/27/2011    Past Surgical History:  Procedure Laterality Date  . TUBAL LIGATION      Prior to Admission medications   Medication Sig Start Date End Date Taking? Authorizing Provider  lisinopril-hydrochlorothiazide (PRINZIDE,ZESTORETIC) 20-25 MG tablet Take 1 tablet by mouth daily.      [provider]    Allergies Sulfa antibiotics  History reviewed. No pertinent family history.  Social History Social History   Tobacco Use  . Smoking status: Never Smoker  . Smokeless tobacco: Never Used  Substance Use Topics  . Alcohol use: Yes    Comment: occasional  . Drug use: No    Review of Systems Constitutional: No fever/chills Cardiovascular: Denies chest pain. Respiratory: Denies shortness of breath. Gastrointestinal: No abdominal pain.  No nausea, no vomiting.    Musculoskeletal: Negative for back pain. Skin: Positive for bruise right breast. Neurological: Negative for headaches, focal weakness or numbness. ___________________________________________   PHYSICAL EXAM:  VITAL SIGNS: ED Triage Vitals  Enc Vitals Group     BP 11/07/18 0754 138/80     Pulse Rate 11/07/18 0754 68     Resp 11/07/18 0754 18     Temp 11/07/18 0754 97.7 F (36.5 C)     Temp Source 11/07/18 0754 Oral     SpO2 11/07/18 0754 98 %     Weight 11/07/18 0755 230 lb (104.3 kg)     Height 11/07/18 0755 5\' 4"  (1.626 m)     Head Circumference --      Peak Flow --      Pain Score 11/07/18 0754 3     Pain Loc --      Pain Edu? --      Excl. in [place]? --    Constitutional: Alert and oriented. Well appearing and in no acute distress. Eyes: Conjunctivae are normal.  Head: Atraumatic. Neck: No stridor.   Cardiovascular: Normal rate, regular rhythm. Grossly normal heart sounds.  Good peripheral circulation. Respiratory: Normal respiratory effort.  No retractions. Lungs CTAB. Musculoskeletal: No lower extremity tenderness nor edema.  No joint effusions. Neurologic:  Normal speech and language. No gross focal neurologic deficits are appreciated. No gait instability. Skin:  Skin is warm, dry and intact.  A 2 cm superficial ecchymotic area without evidence of a hematoma  or cystic formation.  No nipple discharge or dimpling.  Area is nontender to palpation at this time. Psychiatric: Mood and affect are normal. Speech and behavior are normal.  ____________________________________________   LABS (all labs ordered are listed, but only abnormal results are displayed)  Labs Reviewed  CBC WITH DIFFERENTIAL/PLATELET - Abnormal; Notable for the following components:      Result Value   RBC 5.14 (*)    Hemoglobin 11.5 (*)    MCV 74.9 (*)    MCH 22.4 (*)    MCHC 29.9 (*)    RDW 15.9 (*)    All other components within normal limits     PROCEDURES  Procedure(s) performed:  None  Procedures  Critical Care performed: No  ____________________________________________   INITIAL IMPRESSION / ASSESSMENT AND PLAN / ED COURSE  As part of my medical decision making, I reviewed the following data within the electronic MEDICAL RECORD NUMBER Notes from prior ED visits and [place] Controlled Substance Database  Patient presents to the ED with complaint of bruise to the right breast in which she is unaware of any injury.  She states she noticed it 2 days ago.  She denies any medications that are an coagulants.  Is not gotten any larger.  Area measures approximately 2 cm and appears to be a resolving ecchymotic area without any evidence of abscess or cystic formation.  Patient was reassured.  She is to follow-up with her PCP if any continued problems or return to the ED if any severe worsening of her symptoms.   ____________________________________________   FINAL CLINICAL IMPRESSION(S) / ED DIAGNOSES  Final diagnoses:  Bruise of breast     ED Discharge Orders    None       Note:  This document was prepared using Dragon voice recognition software and may include unintentional dictation errors.    Johnn Hai, PA-C 11/07/18 1314    Nena Polio, MD 11/07/18 [phone]

## 2019-03-11 DIAGNOSIS — G4730 Sleep apnea, unspecified: Secondary | ICD-10-CM | POA: Insufficient documentation

## 2019-03-11 DIAGNOSIS — D649 Anemia, unspecified: Secondary | ICD-10-CM | POA: Insufficient documentation

## 2019-03-12 ENCOUNTER — Ambulatory Visit: Payer: 59 | Admitting: Podiatry

## 2019-03-12 ENCOUNTER — Encounter: Payer: Self-pay | Admitting: Podiatry

## 2019-03-12 ENCOUNTER — Other Ambulatory Visit: Payer: Self-pay

## 2019-03-12 ENCOUNTER — Ambulatory Visit (INDEPENDENT_AMBULATORY_CARE_PROVIDER_SITE_OTHER): Payer: 59

## 2019-03-12 VITALS — BP 125/76 | HR 86

## 2019-03-12 DIAGNOSIS — M722 Plantar fascial fibromatosis: Secondary | ICD-10-CM | POA: Diagnosis not present

## 2019-03-12 MED ORDER — MELOXICAM 15 MG PO TABS
15.0000 mg | ORAL_TABLET | Freq: Every day | ORAL | 1 refills | Status: DC
Start: 2019-03-12 — End: 2019-06-20

## 2019-03-12 MED ORDER — METHYLPREDNISOLONE 4 MG PO TBPK: ORAL_TABLET | ORAL | 0 refills | Status: DC

## 2019-03-13 NOTE — Progress Notes (Signed)
   Subjective: 53 y.o. female presenting today as a new patient with a chief complaint of pain to the plantar aspect of the right heel that began 1-2 months ago. She believes she has plantar fasciitis. Walking and standing for long periods of time increases the pain. She has not had any treatment for her symptoms. Patient is here for further evaluation and treatment.   Past Medical History:  Diagnosis Date  . Anemia   . Diabetes mellitus    adult onset at age of 49  . Hyperlipidemia   . Hypertension   . Sleep apnea      Objective: Physical Exam General: The patient is alert and oriented x3 in no acute distress.  Dermatology: Skin is warm, dry and supple bilateral lower extremities. Negative for open lesions or macerations bilateral.   Vascular: Dorsalis Pedis and Posterior Tibial pulses palpable bilateral.  Capillary fill time is immediate to all digits.  Neurological: Epicritic and protective threshold intact bilateral.   Musculoskeletal: Tenderness to palpation to the plantar aspect of the right heel along the plantar fascia. All other joints range of motion within normal limits bilateral. Strength 5/5 in all groups bilateral.   Radiographic exam: Normal osseous mineralization. Joint spaces preserved. No fracture/dislocation/boney destruction. No other soft tissue abnormalities or radiopaque foreign bodies.   Assessment: 1. Plantar fasciitis right  Plan of Care:  1. Patient evaluated. Xrays reviewed.   2. Injection of 0.5cc Celestone soluspan injected into the right plantar fascia  3. Rx for Medrol Dose Pack placed 4. Rx for Meloxicam ordered for patient. 5. Plantar fascial band(s) dispensed 6. Instructed patient regarding therapies and modalities at home to alleviate symptoms.  7. Return to clinic in 4 weeks.    Works at [employer].    Edrick Kins, DPM [facility]  Dr. Edrick Kins, DPM    [address], [place] [address]                Office [phone]  Fax [phone]

## 2019-04-09 ENCOUNTER — Encounter: Payer: Self-pay | Admitting: Podiatry

## 2019-04-09 ENCOUNTER — Ambulatory Visit (INDEPENDENT_AMBULATORY_CARE_PROVIDER_SITE_OTHER): Payer: 59 | Admitting: Podiatry

## 2019-04-09 ENCOUNTER — Other Ambulatory Visit: Payer: Self-pay

## 2019-04-09 VITALS — Temp 98.2°F

## 2019-04-09 DIAGNOSIS — M722 Plantar fascial fibromatosis: Secondary | ICD-10-CM | POA: Diagnosis not present

## 2019-04-09 MED ORDER — DICLOFENAC SODIUM 75 MG PO TBEC
75.0000 mg | DELAYED_RELEASE_TABLET | Freq: Two times a day (BID) | ORAL | 1 refills | Status: DC
Start: 2019-04-09 — End: 2019-05-24

## 2019-04-09 NOTE — Patient Instructions (Signed)
Powersteps insoles

## 2019-04-09 NOTE — Progress Notes (Signed)
   Subjective: 53 y.o. female presenting today for follow-up treatment and evaluation regarding plantar fasciitis to the right foot.  Patient states that the injections helped for about a week as well as the prednisone pack.  She is currently taking meloxicam daily without any significant improvement.  She is also wearing her plantar fascial brace as directed.  No new complaints at this time  Past Medical History:  Diagnosis Date  . Anemia   . Diabetes mellitus    adult onset at age of 4  . Hyperlipidemia   . Hypertension   . Sleep apnea      Objective: Physical Exam General: The patient is alert and oriented x3 in no acute distress.  Dermatology: Skin is warm, dry and supple bilateral lower extremities. Negative for open lesions or macerations bilateral.   Vascular: Dorsalis Pedis and Posterior Tibial pulses palpable bilateral.  Capillary fill time is immediate to all digits.  Neurological: Epicritic and protective threshold intact bilateral.   Musculoskeletal: Tenderness to palpation to the plantar aspect of the right heel along the plantar fascia. All other joints range of motion within normal limits bilateral. Strength 5/5 in all groups bilateral.    Assessment: 1. Plantar fasciitis right  Plan of Care:  1. Patient evaluated.  2. Injection of 0.5cc Celestone soluspan injected into the right plantar fascia  3.  Discontinue meloxicam.  Today wearing the prescribed diclofenac 75 mg twice daily to see if this helps to improve her symptoms 4.  Recommend OTC power step insoles online 5.  Continue plantar fascia brace as needed 6.  Return to clinic in 4 weeks  Works at [employer].    Edrick Kins, DPM [facility]  Dr. Edrick Kins, DPM    [address], [place] [address]                Office [phone]  Fax [phone]

## 2019-05-10 ENCOUNTER — Ambulatory Visit: Payer: 59 | Admitting: Podiatry

## 2019-05-24 ENCOUNTER — Other Ambulatory Visit: Payer: Self-pay

## 2019-05-24 ENCOUNTER — Encounter: Payer: Self-pay | Admitting: Podiatry

## 2019-05-24 ENCOUNTER — Ambulatory Visit (INDEPENDENT_AMBULATORY_CARE_PROVIDER_SITE_OTHER): Payer: 59 | Admitting: Podiatry

## 2019-05-24 VITALS — Temp 96.8°F | Resp 16

## 2019-05-24 DIAGNOSIS — M722 Plantar fascial fibromatosis: Secondary | ICD-10-CM

## 2019-05-24 MED ORDER — TRAMADOL HCL 50 MG PO TABS: 50.0000 mg | ORAL_TABLET | Freq: Four times a day (QID) | ORAL | 0 refills | Status: AC | PRN

## 2019-05-24 MED ORDER — DICLOFENAC SODIUM 75 MG PO TBEC: 75.0000 mg | DELAYED_RELEASE_TABLET | Freq: Two times a day (BID) | ORAL | 1 refills | Status: DC

## 2019-05-24 NOTE — Patient Instructions (Signed)
Pre-Operative Instructions  Congratulations, you have decided to take an important step towards improving your quality of life.  You can be assured that the doctors and staff at [facility] will be with you every step of the way.  Here are some important things you should know:  1. Plan to be at the surgery center/hospital at least 1 (one) hour prior to your scheduled time, unless otherwise directed by the surgical center/hospital staff.  You must have a responsible adult accompany you, remain during the surgery and drive you home.  Make sure you have directions to the surgical center/hospital to ensure you arrive on time. 2. If you are having surgery at [facility] or [facility] hospitals, you will need a copy of your medical history and physical form from your family physician within one month prior to the date of surgery. We will give you a form for your primary physician to complete.  3. We make every effort to accommodate the date you request for surgery.  However, there are times where surgery dates or times have to be moved.  We will contact you as soon as possible if a change in schedule is required.   4. No aspirin/ibuprofen for one week before surgery.  If you are on aspirin, any non-steroidal anti-inflammatory medications (Mobic, Aleve, Ibuprofen) should not be taken seven (7) days prior to your surgery.  You make take Tylenol for pain prior to surgery.  5. Medications - If you are taking daily heart and blood pressure medications, seizure, reflux, allergy, asthma, anxiety, pain or diabetes medications, make sure you notify the surgery center/hospital before the day of surgery so they can tell you which medications you should take or avoid the day of surgery. 6. No food or drink after midnight the night before surgery unless directed otherwise by surgical center/hospital staff. 7. No alcoholic beverages 24-hours prior to surgery.  No smoking 24-hours prior or 24-hours after  surgery. 8. Wear loose pants or shorts. They should be loose enough to fit over bandages, boots, and casts. 9. Don't wear slip-on shoes. Sneakers are preferred. 10. Bring your boot with you to the surgery center/hospital.  Also bring crutches or a walker if your physician has prescribed it for you.  If you do not have this equipment, it will be provided for you after surgery. 11. If you have not been contacted by the surgery center/hospital by the day before your surgery, call to confirm the date and time of your surgery. 12. Leave-time from work may vary depending on the type of surgery you have.  Appropriate arrangements should be made prior to surgery with your employer. 13. Prescriptions will be provided immediately following surgery by your doctor.  Fill these as soon as possible after surgery and take the medication as directed. Pain medications will not be refilled on weekends and must be approved by the doctor. 14. Remove nail polish on the operative foot and avoid getting pedicures prior to surgery. 15. Wash the night before surgery.  The night before surgery wash the foot and leg well with water and the antibacterial soap provided. Be sure to pay special attention to beneath the toenails and in between the toes.  Wash for at least three (3) minutes. Rinse thoroughly with water and dry well with a towel.  Perform this wash unless told not to do so by your physician.  Enclosed: 1 Ice pack (please put in freezer the night before surgery)   1 Hibiclens skin cleaner     Pre-op instructions  If you have any questions regarding the instructions, please do not hesitate to call our office.  [place]: [address], [place], [place] [address] -- [phone]  [place]: [address] [place], [place] [address] -- [phone]  [place]: [address]  [place], [place] [address] -- [phone]  [place]: [address], [place], [place] [address] -- [phone]  Website:  [email or web address] 

## 2019-05-26 NOTE — Progress Notes (Signed)
   Subjective: 53 y.o. female presenting today for follow-up evaluation of plantar fasciitis of the right foot. She reports continued sharp pain, especially in the morning. She states the injection she received only provided relief for a few days. She has been taking Diclofenac, which helps, but only for a few hours. She has been using the fascial brace and inserts as well with no significant relief. Standing and walking for long periods of time increases the pain. Patient is here for further evaluation and treatment.   Past Medical History:  Diagnosis Date  . Anemia   . Diabetes mellitus    adult onset at age of 43  . Hyperlipidemia   . Hypertension   . Sleep apnea      Objective: Physical Exam General: The patient is alert and oriented x3 in no acute distress.  Dermatology: Skin is warm, dry and supple bilateral lower extremities. Negative for open lesions or macerations bilateral.   Vascular: Dorsalis Pedis and Posterior Tibial pulses palpable bilateral.  Capillary fill time is immediate to all digits.  Neurological: Epicritic and protective threshold intact bilateral.   Musculoskeletal: Tenderness to palpation to the plantar aspect of the right heel along the plantar fascia. All other joints range of motion within normal limits bilateral. Strength 5/5 in all groups bilateral.    Assessment: 1. Plantar fasciitis right  Plan of Care:  1. Patient evaluated.  2. Today we discussed the conservative versus surgical management of the presenting pathology. The patient opts for surgical management. All possible complications and details of the procedure were explained. All patient questions were answered. No guarantees were expressed or implied. 3. Authorization for surgery was initiated today. Surgery will consist of EPF right.  4. CAM boot dispensed.  5. Refill prescription for Diclofenac provided to patient.  6. Prescription for Tramadol provided to patient.  7. Return to clinic  one week post op.   Works at [employer].    Edrick Kins, DPM [facility]  Dr. Edrick Kins, DPM    [address], [place] [address]                Office [phone]  Fax [phone]

## 2019-05-27 ENCOUNTER — Telehealth: Payer: Self-pay | Admitting: *Deleted

## 2019-05-27 NOTE — Telephone Encounter (Addendum)
"  Dr. Amalia Hailey told me to give you a call to schedule my surgery.  He told me to call you this week."  Do you have a date that you would like?  "He told me it would have to be sometime in July because he's already booked."  Dr. Amalia Hailey can do it on June 20, 2019.  "Okay, that's fine.  Have you got it approved by my insurance yet?  How much will I have to pay?"  I have not checked your insurance at this time but I will make sure it's taken care of prior to your surgery date.   I will get Jocelyn Lamer in our insurance department to call you and give you an estimate.  She can only give you the professional fee.  You need to call the surgical center and ask them for the facility fee as well as the anesthesia fee.  The phone number to the surgery center is on the back of the brochure that we gave you.  "Okay thank you."

## 2019-06-13 ENCOUNTER — Telehealth: Payer: Self-pay | Admitting: *Deleted

## 2019-06-13 NOTE — Telephone Encounter (Signed)
DOS 06/20/2019; 29893 - ENDOSCOPIC PLANTAR FASCIOTOMY RIGHT FOOT  UHC: Effective Date - 01/19/2019 - 12/19/2019   Individual In-Network - Tier 1 (Calendar Year) Deductible $525.95 MET YTD  $974.05 remaining  $1,500.00 Plan Amt.   Out-of-Pocket $0.00 MET YTD  $4,000.00 remaining  $4,000.00 Plan Amt.  Physician Fees for Surgical and Medical Services 60% of eligible expenses after satisfying the deductible.    Notification/Prior Authorization is required for one or more of the procedures you have entered. The notification/prior authorization reference number is [id number].  

## 2019-06-13 NOTE — Telephone Encounter (Signed)
COVERAGE STATUS  1-1 Code  Description  Coverage Status DECISION DATE    [facility]  Coverage determination is reflected for the facility admission and is not a guarantee of payment for ongoing services. Covered/Approved 06/13/2019  1 [id number] Endoscopic plantar fasciotomy  Covered/Approved 06/13/2019

## 2019-06-20 ENCOUNTER — Other Ambulatory Visit: Payer: Self-pay | Admitting: Podiatry

## 2019-06-20 DIAGNOSIS — M722 Plantar fascial fibromatosis: Secondary | ICD-10-CM | POA: Diagnosis not present

## 2019-06-20 MED ORDER — OXYCODONE-ACETAMINOPHEN 5-325 MG PO TABS: 1.0000 | ORAL_TABLET | Freq: Four times a day (QID) | ORAL | 0 refills | Status: DC | PRN

## 2019-06-20 MED ORDER — MELOXICAM 15 MG PO TABS: 15.0000 mg | ORAL_TABLET | Freq: Every day | ORAL | 1 refills | Status: DC

## 2019-06-20 NOTE — Progress Notes (Signed)
.  postop

## 2019-06-28 ENCOUNTER — Ambulatory Visit (INDEPENDENT_AMBULATORY_CARE_PROVIDER_SITE_OTHER): Payer: 59 | Admitting: Podiatry

## 2019-06-28 ENCOUNTER — Encounter: Payer: Self-pay | Admitting: Podiatry

## 2019-06-28 ENCOUNTER — Other Ambulatory Visit: Payer: Self-pay

## 2019-06-28 VITALS — BP 152/87 | Temp 97.2°F

## 2019-06-28 DIAGNOSIS — Z9889 Other specified postprocedural states: Secondary | ICD-10-CM

## 2019-06-28 DIAGNOSIS — M79676 Pain in unspecified toe(s): Secondary | ICD-10-CM

## 2019-06-28 DIAGNOSIS — M722 Plantar fascial fibromatosis: Secondary | ICD-10-CM

## 2019-07-01 NOTE — Progress Notes (Signed)
   Subjective:  Patient presents today status post EPF right. DOS: 06/20/2019. She states she is doing well. She denies any significant pain or modifying factors. Patient is here for further evaluation and treatment.    Past Medical History:  Diagnosis Date  . Anemia   . Diabetes mellitus    adult onset at age of 49  . Hyperlipidemia   . Hypertension   . Sleep apnea       Objective/Physical Exam Neurovascular status intact.  Skin incisions appear to be well coapted with sutures and staples intact. No sign of infectious process noted. No dehiscence. No active bleeding noted. Moderate edema noted to the surgical extremity.  Assessment: 1. s/p EPF right. DOS: 06/20/2019   Plan of Care:  1. Patient was evaluated.  2. Dressing changed.  3. Continue weightbearing in CAM boot.  4. Return to clinic in one week for suture removal.    Edrick Kins, DPM [facility]  Dr. Edrick Kins, [address]                                        [place], [place] [address]                Office [phone]  Fax [phone]

## 2019-07-05 ENCOUNTER — Other Ambulatory Visit: Payer: Self-pay

## 2019-07-05 ENCOUNTER — Ambulatory Visit (INDEPENDENT_AMBULATORY_CARE_PROVIDER_SITE_OTHER): Payer: Self-pay | Admitting: Podiatry

## 2019-07-05 ENCOUNTER — Encounter: Payer: Self-pay | Admitting: Podiatry

## 2019-07-05 VITALS — Temp 98.2°F

## 2019-07-05 DIAGNOSIS — Z9889 Other specified postprocedural states: Secondary | ICD-10-CM

## 2019-07-05 DIAGNOSIS — M722 Plantar fascial fibromatosis: Secondary | ICD-10-CM

## 2019-07-08 NOTE — Progress Notes (Signed)
   Subjective:  Patient presents today status post EPF right. DOS: 06/20/2019. She states she is doing well overall but reports some soreness that began earlier today. She reports associated numbness of the toes. She has been using the CAM boot as directed. She denies any modifying factors. Patient is here for further evaluation and treatment.    Past Medical History:  Diagnosis Date  . Anemia   . Diabetes mellitus    adult onset at age of 49  . Hyperlipidemia   . Hypertension   . Sleep apnea       Objective/Physical Exam Neurovascular status intact.  Skin incisions appear to be well coapted with sutures and staples intact. No sign of infectious process noted. No dehiscence. No active bleeding noted. Moderate edema noted to the surgical extremity.  Assessment: 1. s/p EPF right. DOS: 06/20/2019   Plan of Care:  1. Patient was evaluated.  2. Sutures removed.  3. Continue using CAM boot for two weeks, then transition into good shoes.  4. Return to clinic in 4 weeks.   Works at [employer] in [place].    Edrick Kins, DPM [facility]  Dr. Edrick Kins, [address]                                        [place], [place] [address]                Office [phone]  Fax [phone]

## 2019-07-11 ENCOUNTER — Encounter: Payer: Self-pay | Admitting: Podiatry

## 2019-08-06 ENCOUNTER — Other Ambulatory Visit: Payer: Self-pay

## 2019-08-06 ENCOUNTER — Encounter: Payer: Self-pay | Admitting: Podiatry

## 2019-08-06 ENCOUNTER — Ambulatory Visit (INDEPENDENT_AMBULATORY_CARE_PROVIDER_SITE_OTHER): Payer: 59 | Admitting: Podiatry

## 2019-08-06 VITALS — Temp 97.7°F

## 2019-08-06 DIAGNOSIS — Z9889 Other specified postprocedural states: Secondary | ICD-10-CM

## 2019-08-06 DIAGNOSIS — M722 Plantar fascial fibromatosis: Secondary | ICD-10-CM

## 2019-08-07 NOTE — Progress Notes (Signed)
   Subjective:  Patient presents today status post EPF right. DOS: 06/20/2019. She states she is improving but she still experiences some soreness after being seated for long periods of time. She reports intermittent numbness of the toes as well. She has been using the CAM boot as directed. Patient is here for further evaluation and treatment.    Past Medical History:  Diagnosis Date  . Anemia   . Diabetes mellitus    adult onset at age of 15  . Hyperlipidemia   . Hypertension   . Sleep apnea     Objective/Physical Exam Neurovascular status intact.  Skin incisions appear to be well coapted. No sign of infectious process noted. No dehiscence. No active bleeding noted. Moderate edema noted to the surgical extremity.  Assessment: 1. s/p EPF right. DOS: 06/20/2019   Plan of Care:  1. Patient was evaluated.  2. May resume full activity with no restrictions.  3. Recommended good shoe gear.  4. Return to clinic as needed.   Works at [employer] in [place].    Edrick Kins, DPM [facility]  Dr. Edrick Kins, [address]                                        [place], [place] [address]                Office [phone]  Fax [phone]

## 2019-08-13 ENCOUNTER — Telehealth: Payer: Self-pay | Admitting: Podiatry

## 2019-08-13 NOTE — Telephone Encounter (Signed)
Pt states she is in a lot of pain. She walks on cement all day. She would like to be prescribe some pain medication.

## 2019-08-15 ENCOUNTER — Other Ambulatory Visit: Payer: Self-pay | Admitting: Podiatry

## 2019-08-15 MED ORDER — OXYCODONE-ACETAMINOPHEN 5-325 MG PO TABS: 1.0000 | ORAL_TABLET | Freq: Four times a day (QID) | ORAL | 0 refills | Status: DC | PRN

## 2019-08-15 NOTE — Progress Notes (Signed)
Prn pain postop

## 2019-08-20 ENCOUNTER — Telehealth: Payer: Self-pay | Admitting: Podiatry

## 2019-08-20 NOTE — Telephone Encounter (Signed)
Pt is requesting a note for work. States she is having extreme pain in her right foot which she had surgery on (EPF) on 06/20/2019.

## 2019-08-21 NOTE — Telephone Encounter (Signed)
Dr. Amalia Hailey, do you want to take her out of work until she comes in on 08/27/2019?  Please advise and send to [place] or [place].  Thanks

## 2019-08-27 ENCOUNTER — Ambulatory Visit (INDEPENDENT_AMBULATORY_CARE_PROVIDER_SITE_OTHER): Payer: 59 | Admitting: Podiatry

## 2019-08-27 ENCOUNTER — Encounter: Payer: Self-pay | Admitting: Podiatry

## 2019-08-27 ENCOUNTER — Other Ambulatory Visit: Payer: Self-pay

## 2019-08-27 DIAGNOSIS — M722 Plantar fascial fibromatosis: Secondary | ICD-10-CM

## 2019-08-27 DIAGNOSIS — Z9889 Other specified postprocedural states: Secondary | ICD-10-CM

## 2019-08-27 MED ORDER — OXYCODONE-ACETAMINOPHEN 5-325 MG PO TABS: 1.0000 | ORAL_TABLET | Freq: Four times a day (QID) | ORAL | 0 refills | Status: DC | PRN

## 2019-08-29 NOTE — Progress Notes (Signed)
   Subjective:  Patient presents today status post EPF right. DOS: 06/20/2019. She reports throbbing pain in the arch and heel of the right foot. The pain began after she went back to work. She has been taking Tylenol for treatment with no significant relief. Patient is here for further evaluation and treatment.    Past Medical History:  Diagnosis Date  . Anemia   . Diabetes mellitus    adult onset at age of 66  . Hyperlipidemia   . Hypertension   . Sleep apnea       Objective/Physical Exam Neurovascular status intact.  Skin incisions appear to be well coapted. No sign of infectious process noted. No dehiscence. No active bleeding noted. Moderate edema noted to the surgical extremity.  Assessment: 1. s/p EPF right. DOS: 06/20/2019   Plan of Care:  1. Patient was evaluated.  2. Recommended OTC Powerstep insoles.  3. Compression anklet dispensed.  4. Refill prescription for Percocet 5/325 mg provided to patient.  5. Return to clinic as needed.   Works at [employer] in [place].    Edrick Kins, DPM [facility]  Dr. Edrick Kins, [address]                                        [place], [place] [address]                Office [phone]  Fax [phone]

## 2019-09-17 ENCOUNTER — Encounter: Payer: Self-pay | Admitting: Podiatry

## 2019-09-17 ENCOUNTER — Telehealth: Payer: Self-pay | Admitting: Podiatry

## 2019-09-17 NOTE — Telephone Encounter (Signed)
Pt is requesting a letter saying she was under our care from 06/20/2019 - 08/15/2019. If able, requested note be e-mailed to [email or web address].

## 2019-12-29 ENCOUNTER — Encounter: Payer: Self-pay | Admitting: Emergency Medicine

## 2019-12-29 ENCOUNTER — Other Ambulatory Visit: Payer: Self-pay

## 2019-12-29 ENCOUNTER — Ambulatory Visit
Admission: EM | Admit: 2019-12-29 | Discharge: 2019-12-29 | Disposition: A | Discharge status: Home / Self Care | Payer: 59 | Attending: Family Medicine | Admitting: Family Medicine

## 2019-12-29 DIAGNOSIS — R05 Cough: Secondary | ICD-10-CM | POA: Diagnosis not present

## 2019-12-29 DIAGNOSIS — E119 Type 2 diabetes mellitus without complications: Secondary | ICD-10-CM | POA: Diagnosis not present

## 2019-12-29 DIAGNOSIS — G4730 Sleep apnea, unspecified: Secondary | ICD-10-CM | POA: Diagnosis not present

## 2019-12-29 DIAGNOSIS — I10 Essential (primary) hypertension: Secondary | ICD-10-CM | POA: Insufficient documentation

## 2019-12-29 DIAGNOSIS — R519 Headache, unspecified: Secondary | ICD-10-CM | POA: Diagnosis present

## 2019-12-29 DIAGNOSIS — E785 Hyperlipidemia, unspecified: Secondary | ICD-10-CM | POA: Diagnosis not present

## 2019-12-29 DIAGNOSIS — R059 Cough, unspecified: Secondary | ICD-10-CM

## 2019-12-29 DIAGNOSIS — Z20822 Contact with and (suspected) exposure to covid-19: Secondary | ICD-10-CM | POA: Diagnosis not present

## 2019-12-29 DIAGNOSIS — Z882 Allergy status to sulfonamides status: Secondary | ICD-10-CM | POA: Insufficient documentation

## 2019-12-29 DIAGNOSIS — R21 Rash and other nonspecific skin eruption: Secondary | ICD-10-CM | POA: Diagnosis not present

## 2019-12-29 MED ORDER — CLOTRIMAZOLE-BETAMETHASONE 1-0.05 % EX CREA: TOPICAL_CREAM | CUTANEOUS | 0 refills | Status: AC

## 2019-12-29 NOTE — ED Triage Notes (Signed)
Pt present to [facility] for covid testing. She was exposed to covid by a [employer]. She states that she has a headache for the last 4 days but no other symptoms.

## 2019-12-29 NOTE — ED Provider Notes (Signed)
[facility] ____________________________________________  Time seen: Approximately 12:56 PM  I have reviewed the triage vital signs and the nursing notes.   HISTORY  Chief Complaint covid testing and Headache   HPI [name] is a 54 y.o. female presenting for COVID-19 testing.  Patient reports she found out yesterday she may have been exposed to COVID-19 from a coworker.  Reports she has had a intermittent headache for the last 4 days described as aching, gradual onset and comes and goes.  Has taken some Tylenol which helps.  Denies vision changes, abrupt onset, paresthesias, confusion, unilateral weakness, syncope or near syncope.  Patient denies cough, sore throat, congestion, chest pain, shortness of breath, vomiting, diarrhea, changes in taste or smell.  Denies any fevers.  Continues eat and drink well.  Patient also reports she has had a itchy rash to her right axilla present for the last 3 to 4 weeks.  Denies any changes in foods, medicines, lotions, detergents or other contacts.  States it feels similar to when she previously had a skin yeast infection.  States the area is very itchy.  Denies pain.  Reports otherwise doing well.  Freddy Finner, NP : PCP   Past Medical History:  Diagnosis Date  . Anemia   . Diabetes mellitus    adult onset at age of 69  . Hyperlipidemia   . Hypertension   . Sleep apnea     Patient Active Problem List   Diagnosis Date Noted  . Anemia 03/11/2019  . Sleep apnea 03/11/2019  . Bilateral carpal tunnel syndrome 03/30/2015  . Menorrhagia 07/27/2011  . Diabetes mellitus type II 07/27/2011  . Obesity 07/27/2011  . Hypertension 07/27/2011  . Hypercholesteremia 07/27/2011    Past Surgical History:  Procedure Laterality Date  . TUBAL LIGATION       No current facility-administered medications for this encounter.  Current Outpatient Medications:  .  lisinopril-hydrochlorothiazide (PRINZIDE,ZESTORETIC) 20-25 MG tablet, Take  1 tablet by mouth daily.  , Disp: , Rfl:  .  clotrimazole-betamethasone (LOTRISONE) cream, Apply to affected area 2 times daily prn for 10 days, Disp: 15 g, Rfl: 0 .  pantoprazole (PROTONIX) 40 MG tablet, , Disp: , Rfl:   Allergies Sulfasalazine and Sulfa antibiotics  History reviewed. No pertinent family history.  Social History Social History   Tobacco Use  . Smoking status: Never Smoker  . Smokeless tobacco: Never Used  Substance Use Topics  . Alcohol use: Yes    Comment: occasional  . Drug use: No    Review of Systems Constitutional: No fever/chills Eyes: No visual changes. ENT: No sore throat. Cardiovascular: Denies chest pain. Respiratory: Denies shortness of breath. Gastrointestinal: No abdominal pain.  No nausea, no vomiting.  No diarrhea.  No constipation. Genitourinary: Negative for dysuria. Musculoskeletal: Negative for back pain. Skin: Positive rash Neurological: Positive headache negative for focal weakness or numbness.   ____________________________________________   PHYSICAL EXAM:  VITAL SIGNS: ED Triage Vitals  Enc Vitals Group     BP 12/29/19 1209 128/85     Pulse Rate 12/29/19 1209 68     Resp 12/29/19 1209 18     Temp 12/29/19 1209 98.1 F (36.7 C)     Temp Source 12/29/19 1209 Oral     SpO2 12/29/19 1209 97 %     Weight 12/29/19 1204 230 lb (104.3 kg)     Height 12/29/19 1204 5\' 4"  (1.626 m)     Head Circumference --      Peak  Flow --      Pain Score 12/29/19 1204 8     Pain Loc --      Pain Edu? --      Excl. in [place]? --     Constitutional: Alert and oriented. Well appearing and in no acute distress. Eyes: Conjunctivae are normal. PERRL. EOMI. no pain with EOMs. ENT      Head: Normocephalic and atraumatic.      Mouth/Throat: Mucous membranes are moist.Oropharynx non-erythematous. Neck: No stridor. Supple without meningismus.  Hematological/Lymphatic/Immunilogical: No cervical lymphadenopathy. Cardiovascular: Normal rate, regular  rhythm. Grossly normal heart sounds.  Good peripheral circulation. Respiratory: Normal respiratory effort without tachypnea nor retractions. Breath sounds are clear and equal bilaterally. No wheezes, rales, rhonchi. Musculoskeletal: Steady gait. Neurologic:  Normal speech and language. No gross focal neurologic deficits are appreciated. Speech is normal. No gait instability.  No ataxia.  No paresthesias. Skin:  Skin is warm, dry.  Except: Right axilla hyperpigmented pruritic patch, nonflaky, nonerythematous, no drainage, nontender. Psychiatric: Mood and affect are normal. Speech and behavior are normal. Patient exhibits appropriate insight and judgment   ___________________________________________   LABS (all labs ordered are listed, but only abnormal results are displayed)  Labs Reviewed  NOVEL CORONAVIRUS, NAA (HOSP ORDER, SEND-OUT TO REF LAB; TAT 18-24 HRS)     PROCEDURES Procedures   INITIAL IMPRESSION / ASSESSMENT AND PLAN / ED COURSE  Pertinent labs & imaging results that were available during my care of the patient were reviewed by me and considered in my medical decision making (see chart for details).  Well-appearing patient.  No acute distress.  COVID-19 testing completed and advice given.  Rest, fluids, supportive care, over-the-counter Tylenol ibuprofen as needed and monitor.  Discussed very strict follow-up and return parameters.  Right axilla rash, suspect candidiasis, will treat with betamethasone with clotrimazole.  Avoid scratching and monitor.Discussed indication, risks and benefits of medications with patient.   Discussed follow up with Primary care physician this week. Discussed follow up and return parameters including no resolution or any worsening concerns. Patient verbalized understanding and agreed to plan.   ____________________________________________   FINAL CLINICAL IMPRESSION(S) / ED DIAGNOSES  Final diagnoses:  Exposure to COVID-19 virus  Acute  nonintractable headache, unspecified headache type  Cough  Rash     ED Discharge Orders         Ordered    clotrimazole-betamethasone (LOTRISONE) cream     12/29/19 1259           Note: This dictation was prepared with Dragon dictation along with smaller phrase technology. Any transcriptional errors that result from this process are unintentional.         Marylene Land, NP 12/29/19 1452

## 2019-12-29 NOTE — Discharge Instructions (Signed)
Take medication as prescribed. Rest. Drink plenty of fluids. Monitor self. Avoid scratching.   Follow up with your primary care physician this week as needed. Return to Urgent care for new or worsening concerns.

## 2019-12-30 LAB — NOVEL CORONAVIRUS, NAA (HOSP ORDER, SEND-OUT TO REF LAB; TAT 18-24 HRS): SARS-CoV-2, NAA: NOT DETECTED

## 2020-01-18 ENCOUNTER — Other Ambulatory Visit: Payer: Self-pay

## 2020-01-18 ENCOUNTER — Encounter: Payer: Self-pay | Admitting: Emergency Medicine

## 2020-01-18 ENCOUNTER — Ambulatory Visit
Admission: EM | Admit: 2020-01-18 | Discharge: 2020-01-18 | Disposition: A | Discharge status: Home / Self Care | Payer: 59 | Attending: Family Medicine | Admitting: Family Medicine

## 2020-01-18 DIAGNOSIS — S39012A Strain of muscle, fascia and tendon of lower back, initial encounter: Secondary | ICD-10-CM | POA: Diagnosis present

## 2020-01-18 DIAGNOSIS — R350 Frequency of micturition: Secondary | ICD-10-CM

## 2020-01-18 LAB — URINALYSIS, COMPLETE (UACMP) WITH MICROSCOPIC
Bacteria, UA: NONE SEEN
Bilirubin Urine: NEGATIVE
Glucose, UA: NEGATIVE mg/dL
Ketones, ur: NEGATIVE mg/dL
Leukocytes,Ua: NEGATIVE
Nitrite: NEGATIVE
Protein, ur: NEGATIVE mg/dL
Specific Gravity, Urine: 1.025 (ref 1.005–1.030)
WBC, UA: NONE SEEN WBC/hpf (ref 0–5)
pH: 5 (ref 5.0–8.0)

## 2020-01-18 MED ORDER — PREDNISONE 20 MG PO TABS: 20.0000 mg | ORAL_TABLET | Freq: Every day | ORAL | 0 refills | Status: DC

## 2020-01-18 MED ORDER — CYCLOBENZAPRINE HCL 10 MG PO TABS: 10.0000 mg | ORAL_TABLET | Freq: Every day | ORAL | 0 refills | Status: DC

## 2020-01-18 NOTE — Discharge Instructions (Signed)
Heat to area, tylenol as needed

## 2020-01-18 NOTE — ED Triage Notes (Signed)
Patient c/o urinary frequency that has been going on for a week.  Patient reports burning when urinating and lower back pain that started today.  Patient denies fevers.

## 2020-01-19 NOTE — ED Provider Notes (Signed)
[facility]    CSN: [id number] Arrival date & time: 01/18/20  1458      History   Chief Complaint Chief Complaint  Patient presents with  . Urinary Frequency  . Dysuria  . Back Pain    HPI [name] is a 54 y.o. female.   54 yo female with a c/o frequent urination for the past week. Denies any pain or burning with urination. Denies hematuria or vaginal discharge. Also c/o low back pain that radiates sometimes into her buttocks.    Urinary Frequency  Dysuria Back Pain Associated symptoms: dysuria     Past Medical History:  Diagnosis Date  . Anemia   . Diabetes mellitus    adult onset at age of 59  . Hyperlipidemia   . Hypertension   . Sleep apnea     Patient Active Problem List   Diagnosis Date Noted  . Anemia 03/11/2019  . Sleep apnea 03/11/2019  . Bilateral carpal tunnel syndrome 03/30/2015  . Menorrhagia 07/27/2011  . Diabetes mellitus type II 07/27/2011  . Obesity 07/27/2011  . Hypertension 07/27/2011  . Hypercholesteremia 07/27/2011    Past Surgical History:  Procedure Laterality Date  . TUBAL LIGATION      OB History    Gravida  6   Para  6   Term      Preterm      AB      Living        SAB      TAB      Ectopic      Multiple      Live Births               Home Medications    Prior to Admission medications   Medication Sig Start Date End Date Taking? Authorizing Provider  lisinopril-hydrochlorothiazide (PRINZIDE,ZESTORETIC) 20-25 MG tablet Take 1 tablet by mouth daily.     Yes [provider]  pantoprazole (PROTONIX) 40 MG tablet  02/12/19  Yes [provider]  clotrimazole-betamethasone (LOTRISONE) cream Apply to affected area 2 times daily prn for 10 days 12/29/19   Marylene Land, NP  cyclobenzaprine (FLEXERIL) 10 MG tablet Take 1 tablet (10 mg total) by mouth at bedtime. 01/18/20   Norval Gable, MD  predniSONE (DELTASONE) 20 MG tablet Take 1 tablet (20 mg total) by mouth daily.  01/18/20   Norval Gable, MD    Family History History reviewed. No pertinent family history.  Social History Social History   Tobacco Use  . Smoking status: Never Smoker  . Smokeless tobacco: Never Used  Substance Use Topics  . Alcohol use: Yes    Comment: occasional  . Drug use: No     Allergies   Sulfasalazine and Sulfa antibiotics   Review of Systems Review of Systems  Genitourinary: Positive for dysuria and frequency.  Musculoskeletal: Positive for back pain.     Physical Exam Triage Vital Signs ED Triage Vitals  Enc Vitals Group     BP 01/18/20 1509 126/84     Pulse Rate 01/18/20 1509 77     Resp 01/18/20 1509 16     Temp 01/18/20 1509 98.1 F (36.7 C)     Temp Source 01/18/20 1509 Oral     SpO2 01/18/20 1509 100 %     Weight 01/18/20 1507 230 lb (104.3 kg)     Height 01/18/20 1507 5\' 4"  (1.626 m)     Head Circumference --      Peak  Flow --      Pain Score 01/18/20 1506 10     Pain Loc --      Pain Edu? --      Excl. in [place]? --    No data found.  Updated Vital Signs BP 126/84 (BP Location: Right Arm)   Pulse 77   Temp 98.1 F (36.7 C) (Oral)   Resp 16   Ht 5\' 4"  (1.626 m)   Wt 104.3 kg   LMP 11/20/2011   SpO2 100%   BMI 39.48 kg/m   Visual Acuity Right Eye Distance:   Left Eye Distance:   Bilateral Distance:    Right Eye Near:   Left Eye Near:    Bilateral Near:     Physical Exam Vitals and nursing note reviewed.  Constitutional:      General: She is not in acute distress.    Appearance: She is not toxic-appearing or diaphoretic.  Cardiovascular:     Rate and Rhythm: Normal rate.  Pulmonary:     Effort: Pulmonary effort is normal. No respiratory distress.  Abdominal:     General: Bowel sounds are normal. There is no distension.     Palpations: Abdomen is soft.     Tenderness: There is no abdominal tenderness.  Musculoskeletal:     Lumbar back: Spasms and tenderness present. No swelling, edema, deformity or lacerations.    Neurological:     Mental Status: She is alert.      UC Treatments / Results  Labs (all labs ordered are listed, but only abnormal results are displayed) Labs Reviewed  URINALYSIS, COMPLETE (UACMP) WITH MICROSCOPIC - Abnormal; Notable for the following components:      Result Value   APPearance HAZY (*)    Hgb urine dipstick TRACE (*)    All other components within normal limits    EKG   Radiology No results found.  Procedures Procedures (including critical care time)  Medications Ordered in UC Medications - No data to display  Initial Impression / Assessment and Plan / UC Course  I have reviewed the triage vital signs and the nursing notes.  Pertinent labs & imaging results that were available during my care of the patient were reviewed by me and considered in my medical decision making (see chart for details).      Final Clinical Impressions(s) / UC Diagnoses   Final diagnoses:  Strain of lumbar region, initial encounter  Urinary frequency     Discharge Instructions     Heat to area, tylenol as needed    ED Prescriptions    Medication Sig Dispense Auth. Provider   cyclobenzaprine (FLEXERIL) 10 MG tablet Take 1 tablet (10 mg total) by mouth at bedtime. 30 tablet Norval Gable, MD   predniSONE (DELTASONE) 20 MG tablet Take 1 tablet (20 mg total) by mouth daily. 5 tablet Norval Gable, MD      1. Lab result and diagnosis reviewed with patient 2. rx as per orders above; reviewed possible side effects, interactions, risks and benefits  3. Recommend supportive treatment as above 4. Follow-up prn if symptoms worsen or don't improve PDMP not reviewed this encounter.   Norval Gable, MD 01/19/20 [phone]

## 2020-04-20 ENCOUNTER — Encounter: Payer: Self-pay | Admitting: Emergency Medicine

## 2020-04-20 DIAGNOSIS — R21 Rash and other nonspecific skin eruption: Secondary | ICD-10-CM | POA: Insufficient documentation

## 2020-04-20 DIAGNOSIS — Z5321 Procedure and treatment not carried out due to patient leaving prior to being seen by health care provider: Secondary | ICD-10-CM | POA: Insufficient documentation

## 2020-04-20 NOTE — ED Triage Notes (Signed)
Pt c/o rash under bilateral breast and around eyes that cause pain and it itching. Pt has been seen by PCP x3 with prescriptions of prednisone, benadryl, powder and allergy pills. Pt reports no relief and worsening rash. Areas are red and inflamed on assessment.

## 2020-04-21 ENCOUNTER — Emergency Department
Admission: EM | Admit: 2020-04-21 | Discharge: 2020-04-21 | Disposition: A | Discharge status: Home / Self Care | Payer: 59 | Attending: Emergency Medicine | Admitting: Emergency Medicine

## 2020-10-15 ENCOUNTER — Other Ambulatory Visit (HOSPITAL_COMMUNITY): Payer: Self-pay | Admitting: Family Medicine

## 2020-10-15 ENCOUNTER — Other Ambulatory Visit: Payer: Self-pay | Admitting: Family Medicine

## 2020-10-15 DIAGNOSIS — E049 Nontoxic goiter, unspecified: Secondary | ICD-10-CM

## 2020-10-23 ENCOUNTER — Other Ambulatory Visit: Payer: Self-pay

## 2020-10-23 ENCOUNTER — Ambulatory Visit
Admission: RE | Admit: 2020-10-23 | Discharge: 2020-10-23 | Disposition: A | Discharge status: Home / Self Care | Payer: 59 | Source: Ambulatory Visit | Attending: Family Medicine | Admitting: Family Medicine

## 2020-10-23 DIAGNOSIS — E049 Nontoxic goiter, unspecified: Secondary | ICD-10-CM

## 2021-02-08 ENCOUNTER — Ambulatory Visit: Payer: 59 | Admitting: Dermatology

## 2021-04-18 ENCOUNTER — Emergency Department
Admission: EM | Admit: 2021-04-18 | Discharge: 2021-04-18 | Disposition: A | Discharge status: Home / Self Care | Payer: 59 | Attending: Emergency Medicine | Admitting: Emergency Medicine

## 2021-04-18 ENCOUNTER — Other Ambulatory Visit: Payer: Self-pay

## 2021-04-18 ENCOUNTER — Emergency Department: Payer: 59

## 2021-04-18 ENCOUNTER — Encounter: Payer: Self-pay | Admitting: Emergency Medicine

## 2021-04-18 DIAGNOSIS — Z79899 Other long term (current) drug therapy: Secondary | ICD-10-CM | POA: Insufficient documentation

## 2021-04-18 DIAGNOSIS — E119 Type 2 diabetes mellitus without complications: Secondary | ICD-10-CM | POA: Insufficient documentation

## 2021-04-18 DIAGNOSIS — S93491A Sprain of other ligament of right ankle, initial encounter: Secondary | ICD-10-CM

## 2021-04-18 DIAGNOSIS — Y9341 Activity, dancing: Secondary | ICD-10-CM | POA: Insufficient documentation

## 2021-04-18 DIAGNOSIS — S93431A Sprain of tibiofibular ligament of right ankle, initial encounter: Secondary | ICD-10-CM | POA: Insufficient documentation

## 2021-04-18 DIAGNOSIS — I10 Essential (primary) hypertension: Secondary | ICD-10-CM | POA: Diagnosis not present

## 2021-04-18 DIAGNOSIS — W19XXXA Unspecified fall, initial encounter: Secondary | ICD-10-CM | POA: Insufficient documentation

## 2021-04-18 DIAGNOSIS — S99911A Unspecified injury of right ankle, initial encounter: Secondary | ICD-10-CM | POA: Diagnosis present

## 2021-04-18 MED ORDER — IBUPROFEN 600 MG PO TABS: 600.0000 mg | ORAL_TABLET | Freq: Three times a day (TID) | ORAL | 0 refills | Status: DC | PRN

## 2021-04-18 MED ORDER — IBUPROFEN 800 MG PO TABS
800.0000 mg | ORAL_TABLET | Freq: Once | ORAL | Status: AC
  Administered 2021-04-18: 800 mg via ORAL
  Filled 2021-04-18: qty 1

## 2021-04-18 NOTE — ED Triage Notes (Signed)
Pt to ED via POV c/o right ankle pain s/p fall yesterday. Pt states that she was intoxicated when she fell. Pt did not hit her head. Pt is in NAD.

## 2021-04-18 NOTE — ED Provider Notes (Signed)
[facility] Emergency Department Provider Note ____________________________________________  Time seen: 1100  I have reviewed the triage vital signs and the nursing notes.  HISTORY  Chief Complaint  Fall and Ankle Pain   HPI [name] is a 55 y.o. female presents to the ER today with complaint of right ankle pain and swelling.  She reports this occurred after a fall yesterday.  She reports she was intoxicated, dancing and ended up falling but not exactly how sure she fell.  She describes the pain as sore and achy, worse with ambulation.  She is able to bear weight but it is painful.  She has noted some associated swelling but denies bruising.  She denies numbness or tingling of the right lower extremity.  She has not taken any medications or applied ice prior to this ER visit.  She denies prior right ankle injury or surgery.  Past Medical History:  Diagnosis Date  . Anemia   . Diabetes mellitus    adult onset at age of 54  . Hyperlipidemia   . Hypertension   . Sleep apnea     Patient Active Problem List   Diagnosis Date Noted  . Anemia 03/11/2019  . Sleep apnea 03/11/2019  . Bilateral carpal tunnel syndrome 03/30/2015  . Menorrhagia 07/27/2011  . Diabetes mellitus type II 07/27/2011  . Obesity 07/27/2011  . Hypertension 07/27/2011  . Hypercholesteremia 07/27/2011    Past Surgical History:  Procedure Laterality Date  . TUBAL LIGATION      Prior to Admission medications   Medication Sig Start Date End Date Taking? Authorizing Provider  ibuprofen (ADVIL) 600 MG tablet Take 1 tablet (600 mg total) by mouth every 8 (eight) hours as needed. 04/18/21  Yes Jearld Fenton, NP  clotrimazole-betamethasone (LOTRISONE) cream Apply to affected area 2 times daily prn for 10 days 12/29/19   Marylene Land, NP  cyclobenzaprine (FLEXERIL) 10 MG tablet Take 1 tablet (10 mg total) by mouth at bedtime. 01/18/20   Norval Gable, MD  lisinopril-hydrochlorothiazide  (PRINZIDE,ZESTORETIC) 20-25 MG tablet Take 1 tablet by mouth daily.      [provider]  pantoprazole (PROTONIX) 40 MG tablet  02/12/19   [provider]  predniSONE (DELTASONE) 20 MG tablet Take 1 tablet (20 mg total) by mouth daily. 01/18/20   Norval Gable, MD    Allergies Sulfasalazine and Sulfa antibiotics  No family history on file.  Social History Social History   Tobacco Use  . Smoking status: Never Smoker  . Smokeless tobacco: Never Used  Vaping Use  . Vaping Use: Never used  Substance Use Topics  . Alcohol use: Yes    Comment: occasional  . Drug use: No    Review of Systems  Constitutional: Negative for fever, chills or body aches. Cardiovascular: Negative for chest pain or chest tightness. Respiratory: Negative for cough or shortness of breath. Musculoskeletal: Positive for right ankle pain and swelling, difficulty with gait.  Negative for right knee pain. Skin: Negative for bruising. Neurological: Positive for RLE focal weakness.  Negative for tingling or numbness. ____________________________________________  PHYSICAL EXAM:  VITAL SIGNS: ED Triage Vitals [04/18/21 1100]  Enc Vitals Group     BP 123/68     Pulse Rate 94     Resp 16     Temp 98.3 F (36.8 C)     Temp Source Oral     SpO2 97 %     Weight 235 lb (106.6 kg)     Height  5\' 4"  (1.626 m)     Head Circumference      Peak Flow      Pain Score 10     Pain Loc      Pain Edu?      Excl. in [place]?     Constitutional: Alert and oriented.  Obese, in no distress. Head: Normocephalic and atraumatic. Eyes: Sclera white.  Normal extraocular movements Cardiovascular: Normal rate, regular rhythm.  Pedal pulses 2+ bilaterally. Respiratory: Normal respiratory effort. No wheezes/rales/rhonchi noted. Musculoskeletal: Normal flexion, extension and rotation of the right ankle.  Pain with palpation over the right lateral malleolus and anteriorly.  1+ swelling noted over the right lateral  malleolus.  She can bear weight but limping gait noted. Neurologic:  Normal speech and language. No gross focal neurologic deficits are appreciated. Skin:  Skin is warm, dry and intact. No bruising noted  ___________________________________________   RADIOLOGY  Imaging Orders     DG Ankle Complete Right IMPRESSION: Negative.   ____________________________________________   INITIAL IMPRESSION / ASSESSMENT AND PLAN / ED COURSE  Acute Right Ankle Pain and Swelling s/p Fall:  DDx include ankle sprain, malleolar fracture Xray right ankle negative for acute fracture Ibuprofen 800 mg PO x 1, kidney function reviewed from 03/23/21- normal Ice applied Air cast applied to RLE RX for Ibuprofen 600 mg TID prn with food Encouraged elevation and ice at home Work note provided ____________________________________________  FINAL CLINICAL IMPRESSION(S) / ED DIAGNOSES  Final diagnoses:  Sprain of anterior talofibular ligament of right ankle, initial encounter      Jearld Fenton, NP 04/18/21 1159    Lucrezia Starch, MD 04/18/21 1255

## 2021-04-18 NOTE — Discharge Instructions (Signed)
You were seen today for right ankle pain and swelling status post a fall.  Your x-ray is negative for acute fracture.  You have been diagnosed with an ankle sprain.  We recommend rest, ice and elevation to reduce pain and swelling.  I am giving you a prescription for ibuprofen to take every 8 hours as needed with food.  We have placed you in an Aircast which you can wear at your discretion for support and comfort.  I have provided you with a work note to return in 2 days.

## 2021-04-18 NOTE — ED Notes (Signed)
Ice applied to ankle

## 2021-04-18 NOTE — ED Notes (Signed)
Aircast applied

## 2021-07-13 ENCOUNTER — Ambulatory Visit: Payer: Self-pay | Admitting: Family Medicine

## 2021-11-15 ENCOUNTER — Ambulatory Visit: Payer: Self-pay | Admitting: Family Medicine

## 2022-02-01 ENCOUNTER — Other Ambulatory Visit: Payer: Self-pay

## 2022-02-01 ENCOUNTER — Emergency Department: Payer: 59

## 2022-02-01 ENCOUNTER — Encounter: Payer: Self-pay | Admitting: Emergency Medicine

## 2022-02-01 ENCOUNTER — Emergency Department
Admission: EM | Admit: 2022-02-01 | Discharge: 2022-02-01 | Disposition: A | Discharge status: Home / Self Care | Payer: 59 | Attending: Emergency Medicine | Admitting: Emergency Medicine

## 2022-02-01 DIAGNOSIS — X500XXA Overexertion from strenuous movement or load, initial encounter: Secondary | ICD-10-CM | POA: Insufficient documentation

## 2022-02-01 DIAGNOSIS — M25511 Pain in right shoulder: Secondary | ICD-10-CM

## 2022-02-01 DIAGNOSIS — S40021A Contusion of right upper arm, initial encounter: Secondary | ICD-10-CM | POA: Insufficient documentation

## 2022-02-01 DIAGNOSIS — S4991XA Unspecified injury of right shoulder and upper arm, initial encounter: Secondary | ICD-10-CM | POA: Diagnosis present

## 2022-02-01 DIAGNOSIS — E119 Type 2 diabetes mellitus without complications: Secondary | ICD-10-CM | POA: Diagnosis not present

## 2022-02-01 DIAGNOSIS — I10 Essential (primary) hypertension: Secondary | ICD-10-CM | POA: Insufficient documentation

## 2022-02-01 MED ORDER — DICLOFENAC SODIUM 1 % EX GEL: 2.0000 g | Freq: Three times a day (TID) | CUTANEOUS | 0 refills | Status: AC | PRN

## 2022-02-01 MED ORDER — DICLOFENAC SODIUM 1 % EX GEL
2.0000 g | Freq: Once | CUTANEOUS | Status: AC
  Administered 2022-02-01: 2 g via TOPICAL
  Filled 2022-02-01: qty 100

## 2022-02-01 MED ORDER — HYDROCODONE-ACETAMINOPHEN 5-325 MG PO TABS: 1.0000 | ORAL_TABLET | Freq: Four times a day (QID) | ORAL | 0 refills | Status: AC | PRN

## 2022-02-01 MED ORDER — ACETAMINOPHEN 500 MG PO TABS
1000.0000 mg | ORAL_TABLET | Freq: Once | ORAL | Status: AC
  Administered 2022-02-01: 1000 mg via ORAL
  Filled 2022-02-01: qty 2

## 2022-02-01 MED ORDER — CELECOXIB 100 MG PO CAPS
100.0000 mg | ORAL_CAPSULE | Freq: Once | ORAL | Status: AC
  Administered 2022-02-01: 100 mg via ORAL
  Filled 2022-02-01: qty 1

## 2022-02-01 NOTE — ED Notes (Signed)
See triage note  presents with pain to right shoulder  states she had rotator cuff surgery in Oct   states she developed pain yesterday  denies any recent injury   states she went back to work but is not able to lift over 5 pds  min swelling noted  area tender to touch

## 2022-02-01 NOTE — ED Provider Notes (Signed)
[facility] Provider Note    Event Date/Time   First MD Initiated Contact with Patient 02/01/22 [phone]     (approximate)   History   Shoulder Pain   HPI  [name] is a 56 y.o. female with past medical history of anemia, DM, intracranial atrial and OSA as well as rotator cuff tear on the right status post mentation and debridement in October of last year followed by orthopedic service at [facility] with weekly PT who presents for assessment of some slight increase in swelling in the right shoulder from baseline.  She denies any recent injuries or falls.  She thinks that a little more painful and swollen yesterday.  He has been using her arm at work but does not recall anything specifically strenuous.  She also notes she has a little bruise in her mid upper arm.  She has not had any other swelling in the forearm or upper arm.  She has no pain at the elbow or wrist.  She denies any chest pain, cough, shortness of breath, back pain, fevers, rashes or any other acute pain in the torso head neck ears throat or other extremities.  He states he took an oxycodone that was left over from last month and has been taking Celebrex once daily but does not feel this is helped much.  No other acute concerns at this time.    Past Medical History:  Diagnosis Date   Anemia    Diabetes mellitus    adult onset at age of 77   Hyperlipidemia    Hypertension    Sleep apnea      Physical Exam  Triage Vital Signs: ED Triage Vitals  Enc Vitals Group     BP 02/01/22 0726 117/67     Pulse Rate 02/01/22 0726 97     Resp 02/01/22 0726 16     Temp 02/01/22 0729 98.4 F (36.9 C)     Temp Source 02/01/22 0729 Oral     SpO2 02/01/22 0726 94 %     Weight 02/01/22 0727 240 lb (108.9 kg)     Height 02/01/22 0727 5\' 4"  (1.626 m)     Head Circumference --      Peak Flow --      Pain Score 02/01/22 0727 10     Pain Loc --      Pain Edu? --      Excl. in [place]? --     Most recent vital  signs: Vitals:   02/01/22 0726 02/01/22 0729  BP: 117/67   Pulse: 97   Resp: 16   Temp:  98.4 F (36.9 C)  SpO2: 94%     General: Awake, no distress.  CV:  Good peripheral perfusion.  2+ radial pulses. Resp:  Normal effort.  Clear bilaterally Abd:  No distention.   Other:  Decreased range of motion and some slight effusion at the right shoulder.  No overlying skin changes warmth or areas of point tenderness.  Patient is full range of motion at the right elbow and wrist.  Sensation is intact in the distribution of the axillary, radial and ulnar as well as median nerves in the right upper extremity.  Patient scapula neck and right upper chest are unremarkable.   ED Results / Procedures / Treatments  Labs (all labs ordered are listed, but only abnormal results are displayed) Labs Reviewed - No data to display   EKG    RADIOLOGY  On my interpretation  of plain film of the right shoulder there is no acute fracture dislocation.  I also reviewed the radiologist interpretation and agree with the findings of no acute process.  He also makes a notation of some degenerative changes around the right AC joint.  No other acute process.   PROCEDURES:     MEDICATIONS ORDERED IN ED: Medications  diclofenac Sodium (VOLTAREN) 1 % topical gel 2 g (2 g Topical Given 02/01/22 0809)  celecoxib (CELEBREX) capsule 100 mg (100 mg Oral Given 02/01/22 0809)  acetaminophen (TYLENOL) tablet 1,000 mg (1,000 mg Oral Given 02/01/22 0809)     IMPRESSION / MDM / ASSESSMENT AND PLAN / ED COURSE  I reviewed the triage vital signs and the nursing notes.                              Differential diagnosis includes, but is not limited to ongoing symptoms related to remote rotator cuff injury that has been surgically repaired that she is currently getting physical therapy for, gout, versus other MSK with very low suspicion based on history and exam for DVT, septic joint, PE or other acute immediate  life-threatening process at this time.  X-ray shows no acute fracture or dislocation.  Advised patient that she can take her Celebrex twice a day and add Tylenol as well.  I will also prescribe Voltaren gel and provide a short course of hydrocodone as she had previously been on this for over a month but ran out.  Discussed importance of close outpatient orthopedic follow-up with orthopedist returning for any new or worsening of symptoms.  Discharged in stable condition.  Strict and precautions advised and discussed.      FINAL CLINICAL IMPRESSION(S) / ED DIAGNOSES   Final diagnoses:  Acute pain of right shoulder     Rx / DC Orders   ED Discharge Orders          Ordered    HYDROcodone-acetaminophen (NORCO/VICODIN) 5-325 MG tablet  Every 6 hours PRN        02/01/22 0752    diclofenac Sodium (VOLTAREN) 1 % GEL  3 times daily PRN        02/01/22 6712             Note:  This document was prepared using Dragon voice recognition software and may include unintentional dictation errors.   Lucrezia Starch, MD 02/01/22 [phone]

## 2022-02-01 NOTE — ED Triage Notes (Signed)
Pt to ED via POV, Pt states that she had rotator cuff surgery in October. Pt reports that yesterday she started having pain in her right shoulder again. Pt denies any injury. Pt states that she is having trouble lifting her arm. Pt is in NAD.

## 2022-02-11 ENCOUNTER — Ambulatory Visit: Payer: 59 | Admitting: Internal Medicine

## 2022-04-14 ENCOUNTER — Ambulatory Visit: Payer: 59 | Admitting: Cardiovascular Disease

## 2022-04-19 ENCOUNTER — Ambulatory Visit: Payer: 59 | Admitting: Cardiology

## 2022-04-27 ENCOUNTER — Ambulatory Visit: Payer: 59 | Admitting: Internal Medicine

## 2022-04-27 NOTE — Progress Notes (Deleted)
   New Outpatient Visit Date: 04/27/2022  Referring Provider: Freddy Finner, NP [place],  [place] [address]  Chief Complaint: ***  HPI:  [name] is a 56 y.o. female who is being seen today for the evaluation of palpitations at the request of Freddy Finner, NP. She has a history of Hypertension, hyperlipidemia, type 2 diabetes mellitus, and obstructive sleep apnea. ***  --------------------------------------------------------------------------------------------------  Cardiovascular History & Procedures: Cardiovascular Problems: Palpitations  Risk Factors: Hypertension, hyperlipidemia, type 2 diabetes mellitus, and obesity  Cath/PCI: None  CV Surgery: None  EP Procedures and Devices: None  Non-Invasive Evaluation(s): None available  Recent CV Pertinent Labs: Lab Results  Component Value Date   K 3.3 (L) 03/28/2018   K 3.8 02/16/2014   BUN 26 (H) 03/28/2018   BUN 14 02/16/2014   CREATININE 0.74 03/28/2018   CREATININE 0.74 02/16/2014    --------------------------------------------------------------------------------------------------  Past Medical History:  Diagnosis Date   Anemia    Diabetes mellitus    adult onset at age of 64   Hyperlipidemia    Hypertension    Sleep apnea     Past Surgical History:  Procedure Laterality Date   ROTATOR CUFF REPAIR Right    TUBAL LIGATION      No outpatient medications have been marked as taking for the 04/27/22 encounter (Appointment) with Belenda Alviar, Harrell Gave, MD.    Allergies: Sulfasalazine and Sulfa antibiotics  Social History   Tobacco Use   Smoking status: Never   Smokeless tobacco: Never  Vaping Use   Vaping Use: Never used  Substance Use Topics   Alcohol use: Yes    Comment: occasional   Drug use: No    No family history on file.  Review of Systems: A 12-system review of systems was performed and was negative except as noted in the  HPI.  --------------------------------------------------------------------------------------------------  Physical Exam: LMP 11/20/2011   General:  *** HEENT: No conjunctival pallor or scleral icterus. Facemask in place. Neck: Supple without lymphadenopathy, thyromegaly, JVD, or HJR. No carotid bruit. Lungs: Normal work of breathing. Clear to auscultation bilaterally without wheezes or crackles. Heart: Regular rate and rhythm without murmurs, rubs, or gallops. Non-displaced PMI. Abd: Bowel sounds present. Soft, NT/ND without hepatosplenomegaly Ext: No lower extremity edema. Radial, PT, and DP pulses are 2+ bilaterally Skin: Warm and dry without rash. Neuro: CNIII-XII intact. Strength and fine-touch sensation intact in upper and lower extremities bilaterally. Psych: Normal mood and affect.  EKG:  ***  Lab Results  Component Value Date   WBC 6.1 11/07/2018   HGB 11.5 (L) 11/07/2018   HCT 38.5 11/07/2018   MCV 74.9 (L) 11/07/2018   PLT 225 11/07/2018    Lab Results  Component Value Date   NA 139 03/28/2018   K 3.3 (L) 03/28/2018   CL 104 03/28/2018   CO2 28 03/28/2018   BUN 26 (H) 03/28/2018   CREATININE 0.74 03/28/2018   GLUCOSE 110 (H) 03/28/2018   ALT 18 03/28/2018    No results found for: CHOL, HDL, LDLCALC, LDLDIRECT, TRIG, CHOLHDL   --------------------------------------------------------------------------------------------------  ASSESSMENT AND PLAN: ***  Alexandra Bush, MD 04/27/2022 2:02 PM

## 2022-04-28 ENCOUNTER — Encounter: Payer: Self-pay | Admitting: Internal Medicine

## 2022-06-22 ENCOUNTER — Telehealth: Payer: 59 | Admitting: Nurse Practitioner

## 2022-06-22 DIAGNOSIS — J40 Bronchitis, not specified as acute or chronic: Secondary | ICD-10-CM | POA: Diagnosis not present

## 2022-06-22 DIAGNOSIS — T3695XA Adverse effect of unspecified systemic antibiotic, initial encounter: Secondary | ICD-10-CM | POA: Diagnosis not present

## 2022-06-22 DIAGNOSIS — B379 Candidiasis, unspecified: Secondary | ICD-10-CM | POA: Diagnosis not present

## 2022-06-22 MED ORDER — BENZONATATE 100 MG PO CAPS: 100.0000 mg | ORAL_CAPSULE | Freq: Three times a day (TID) | ORAL | 0 refills | Status: DC | PRN

## 2022-06-22 MED ORDER — ALBUTEROL SULFATE HFA 108 (90 BASE) MCG/ACT IN AERS: 2.0000 | INHALATION_SPRAY | Freq: Four times a day (QID) | RESPIRATORY_TRACT | 0 refills | Status: AC | PRN

## 2022-06-22 MED ORDER — AZITHROMYCIN 250 MG PO TABS: ORAL_TABLET | ORAL | 0 refills | Status: AC

## 2022-06-22 NOTE — Progress Notes (Signed)
We are sorry that you are not feeling well.  Here is how we plan to help!  Based on your presentation I believe you most likely have A cough due to bacteria.  When patients have a fever and a productive cough with a change in color or increased sputum production, we are concerned about bacterial bronchitis.  If left untreated it can progress to pneumonia.  If your symptoms do not improve with your treatment plan it is important that you contact your provider.   I have prescribed Azithromyin 250 mg: two tablets now and then one tablet daily for 4 additonal days    In addition you may use A prescription cough medication called Tessalon Perles '100mg'$ . You may take 1-2 capsules every 8 hours as needed for your cough.  We will also refill your inhaler to assure you have enough for this acute illness.   From your responses in the eVisit questionnaire you describe inflammation in the upper respiratory tract which is causing a significant cough.  This is commonly called Bronchitis and has four common causes:   Allergies Viral Infections Acid Reflux Bacterial Infection Allergies, viruses and acid reflux are treated by controlling symptoms or eliminating the cause. An example might be a cough caused by taking certain blood pressure medications. You stop the cough by changing the medication. Another example might be a cough caused by acid reflux. Controlling the reflux helps control the cough.  USE OF BRONCHODILATOR ("RESCUE") INHALERS: There is a risk from using your bronchodilator too frequently.  The risk is that over-reliance on a medication which only relaxes the muscles surrounding the breathing tubes can reduce the effectiveness of medications prescribed to reduce swelling and congestion of the tubes themselves.  Although you feel brief relief from the bronchodilator inhaler, your asthma may actually be worsening with the tubes becoming more swollen and filled with mucus.  This can delay other crucial  treatments, such as oral steroid medications. If you need to use a bronchodilator inhaler daily, several times per day, you should discuss this with your provider.  There are probably better treatments that could be used to keep your asthma under control.     HOME CARE Only take medications as instructed by your medical team. Complete the entire course of an antibiotic. Drink plenty of fluids and get plenty of rest. Avoid close contacts especially the very young and the elderly Cover your mouth if you cough or cough into your sleeve. Always remember to wash your hands A steam or ultrasonic humidifier can help congestion.   GET HELP RIGHT AWAY IF: You develop worsening fever. You become short of breath You cough up blood. Your symptoms persist after you have completed your treatment plan MAKE SURE YOU  Understand these instructions. Will watch your condition. Will get help right away if you are not doing well or get worse.    Thank you for choosing an e-visit.  Your e-visit answers were reviewed by a board certified advanced clinical practitioner to complete your personal care plan. Depending upon the condition, your plan could have included both over the counter or prescription medications.  Please review your pharmacy choice. Make sure the pharmacy is open so you can pick up prescription now. If there is a problem, you may contact your provider through [employer] and have the prescription routed to another pharmacy.  Your safety is important to [place]. If you have drug allergies check your prescription carefully.   For the next 24 hours you  can use MyChart to ask questions about today's visit, request a non-urgent call back, or ask for a work or school excuse. You will get an email in the next two days asking about your experience. I hope that your e-visit has been valuable and will speed your recovery.   I spent approximately 7 minutes reviewing the patient's history, current  symptoms and coordinating their plan of care today.    Meds ordered this encounter  Medications   azithromycin (ZITHROMAX) 250 MG tablet    Sig: Take 2 tablets on day 1, then 1 tablet daily on days 2 through 5    Dispense:  6 tablet    Refill:  0   albuterol (VENTOLIN HFA) 108 (90 Base) MCG/ACT inhaler    Sig: Inhale 2 puffs into the lungs every 6 (six) hours as needed for wheezing or shortness of breath.    Dispense:  8 g    Refill:  0   benzonatate (TESSALON) 100 MG capsule    Sig: Take 1 capsule (100 mg total) by mouth 3 (three) times daily as needed.    Dispense:  30 capsule    Refill:  0

## 2022-06-23 MED ORDER — FLUCONAZOLE 150 MG PO TABS: 150.0000 mg | ORAL_TABLET | Freq: Once | ORAL | 0 refills | Status: AC

## 2022-06-23 NOTE — Progress Notes (Signed)
Diflucan sent for antibiotic induced yeast infection

## 2022-06-23 NOTE — Addendum Note (Signed)
Addended by: Apolonio Schneiders E on: 06/23/2022 07:01 AM   Modules accepted: Orders

## 2022-07-20 ENCOUNTER — Telehealth: Payer: 59 | Admitting: Physician Assistant

## 2022-07-20 DIAGNOSIS — J069 Acute upper respiratory infection, unspecified: Secondary | ICD-10-CM

## 2022-07-20 NOTE — Progress Notes (Signed)
Because you were recently seen for bronchitis and now you are having continued/recurrent congestion, cough and wheezing, I feel your condition warrants further evaluation and I recommend that you be seen in a face to face visit.   NOTE: There will be NO CHARGE for this eVisit   If you are having a true medical emergency please call 911.      For an urgent face to face visit, [facility] has seven urgent care centers for your convenience:     [facility] [place] Get Driving Directions [phone] [place], [place] [address]    [facility]) Get Driving Directions [phone] [place], [place] [address]  [facility] [facility]) Get Driving Directions [phone] [address] [place],  [place]  [address]  [facility] [address] [place] Get Driving Directions  [phone] [address] [place],  [place] [address]   [facility] Get Driving Directions [phone] [place], [place], [place] [address]   [facility] Get Driving Directions  [phone] [address] [place], [place] [address]   [facility] at  Get Driving Directions [phone] [address], [place], [place] [address]  Your MyChart E-visit questionnaire answers were reviewed by a board certified advanced clinical practitioner to complete your personal care plan based on your specific symptoms.  Thank you for using e-Visits.

## 2022-07-21 ENCOUNTER — Ambulatory Visit: Payer: Self-pay

## 2023-06-20 ENCOUNTER — Other Ambulatory Visit: Payer: Self-pay | Admitting: Primary Care

## 2023-06-20 DIAGNOSIS — Z1231 Encounter for screening mammogram for malignant neoplasm of breast: Secondary | ICD-10-CM

## 2023-06-21 ENCOUNTER — Inpatient Hospital Stay
Admission: RE | Admit: 2023-06-21 | Discharge: 2023-06-21 | Disposition: A | Discharge status: Home / Self Care | Payer: Self-pay | Source: Ambulatory Visit | Attending: Primary Care | Admitting: Primary Care

## 2023-06-21 ENCOUNTER — Other Ambulatory Visit: Payer: Self-pay | Admitting: *Deleted

## 2023-06-21 DIAGNOSIS — Z1231 Encounter for screening mammogram for malignant neoplasm of breast: Secondary | ICD-10-CM

## 2023-06-28 ENCOUNTER — Ambulatory Visit
Admission: RE | Admit: 2023-06-28 | Discharge: 2023-06-28 | Disposition: A | Discharge status: Home / Self Care | Payer: No Typology Code available for payment source | Source: Ambulatory Visit | Attending: Primary Care | Admitting: Primary Care

## 2023-06-28 DIAGNOSIS — Z1231 Encounter for screening mammogram for malignant neoplasm of breast: Secondary | ICD-10-CM | POA: Insufficient documentation

## 2023-08-05 IMAGING — CR DG SHOULDER 2+V*R*
1 series · 3 of 3 positions shown · non-contrast
Comparison: July 20, 2017.

CLINICAL DATA: Acute right shoulder pain without known injury.

EXAM:
RIGHT SHOULDER - 2+ VIEW

[Series 1: dg shoulder right · 0.14mm/px · 3 of 3 slices shown]
[im 1/3]
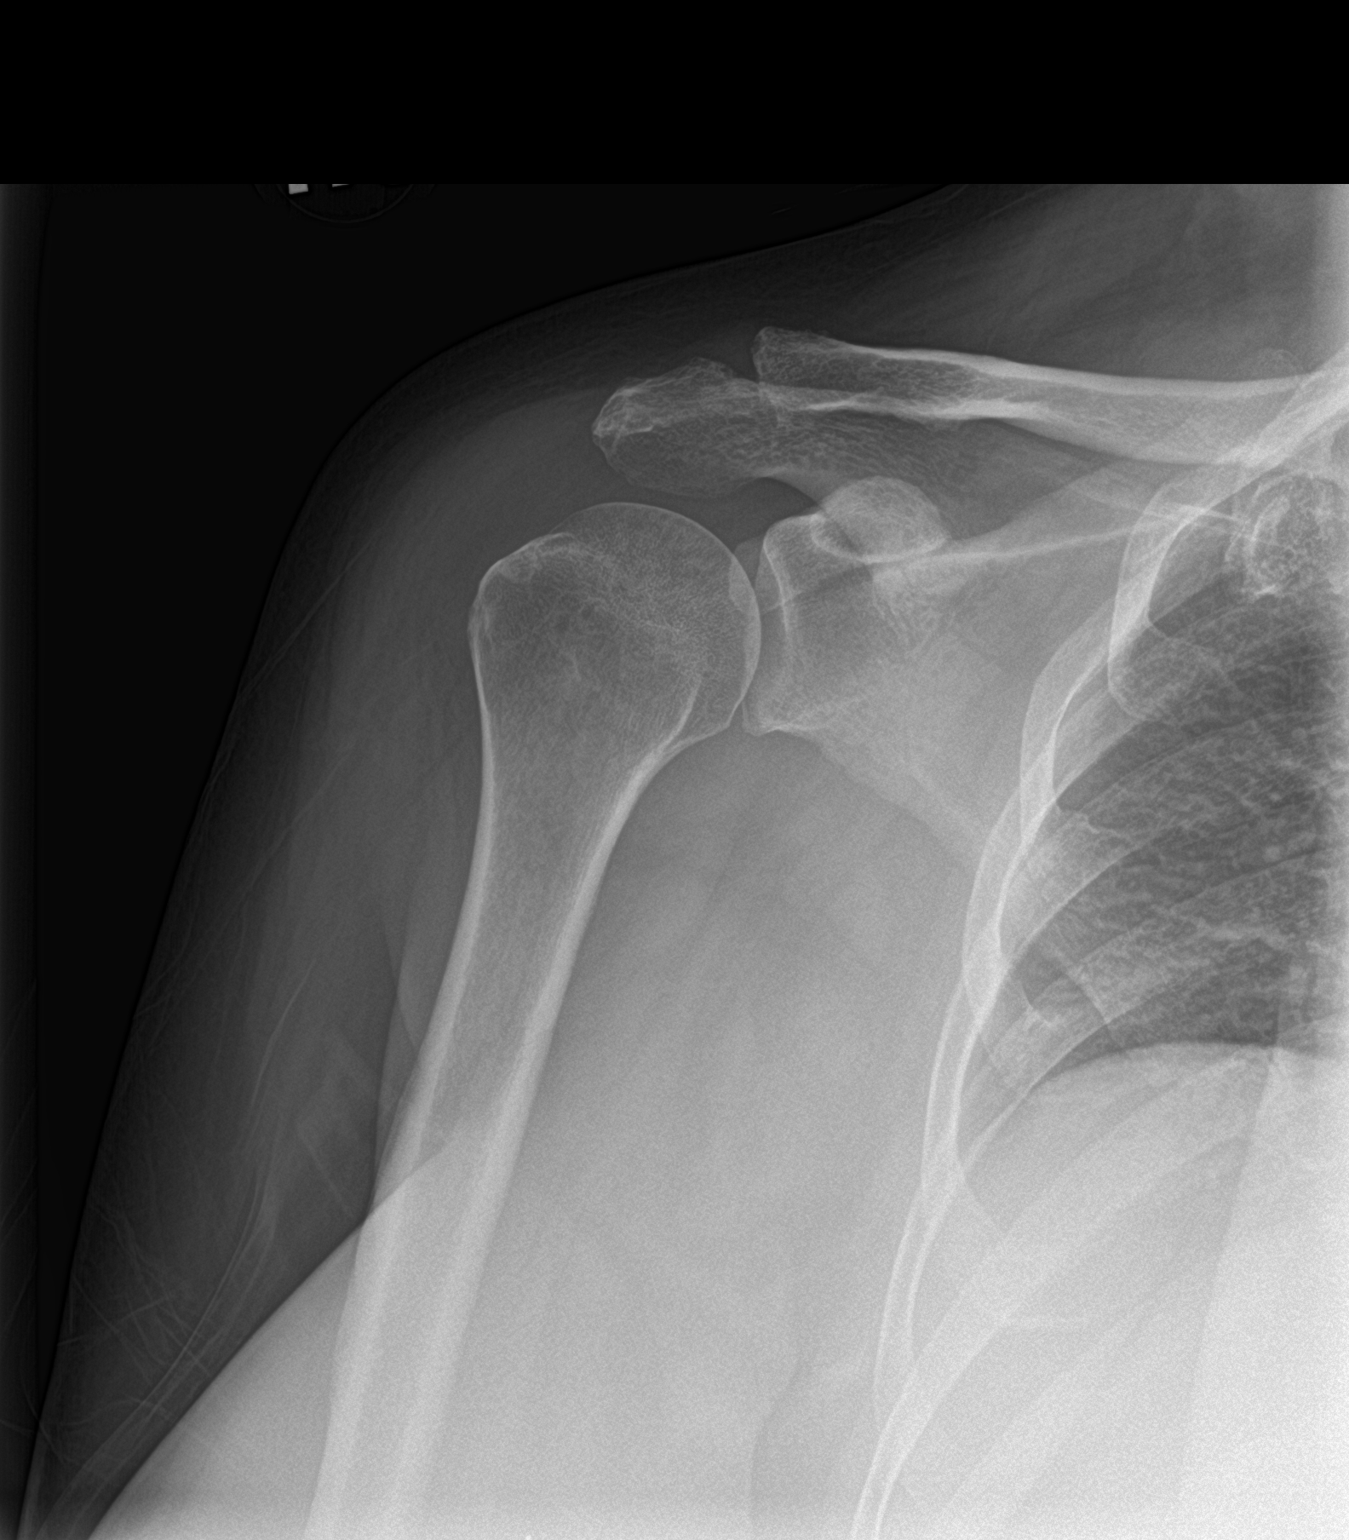
[im 2/3]
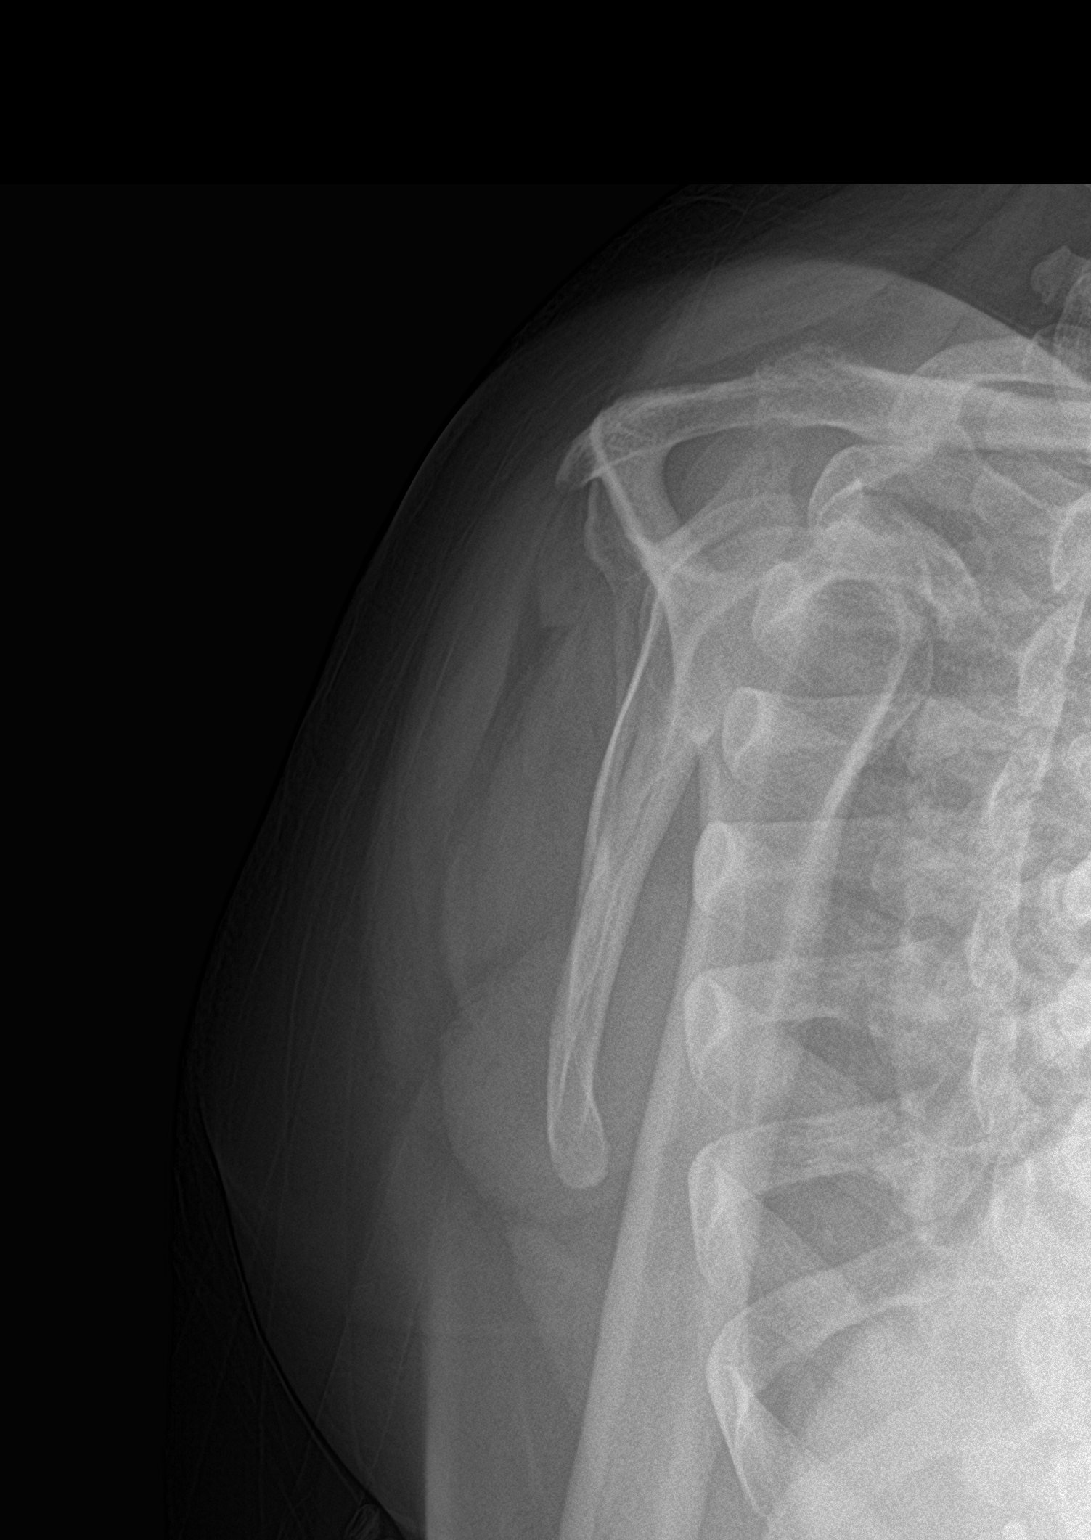
[im 3/3]
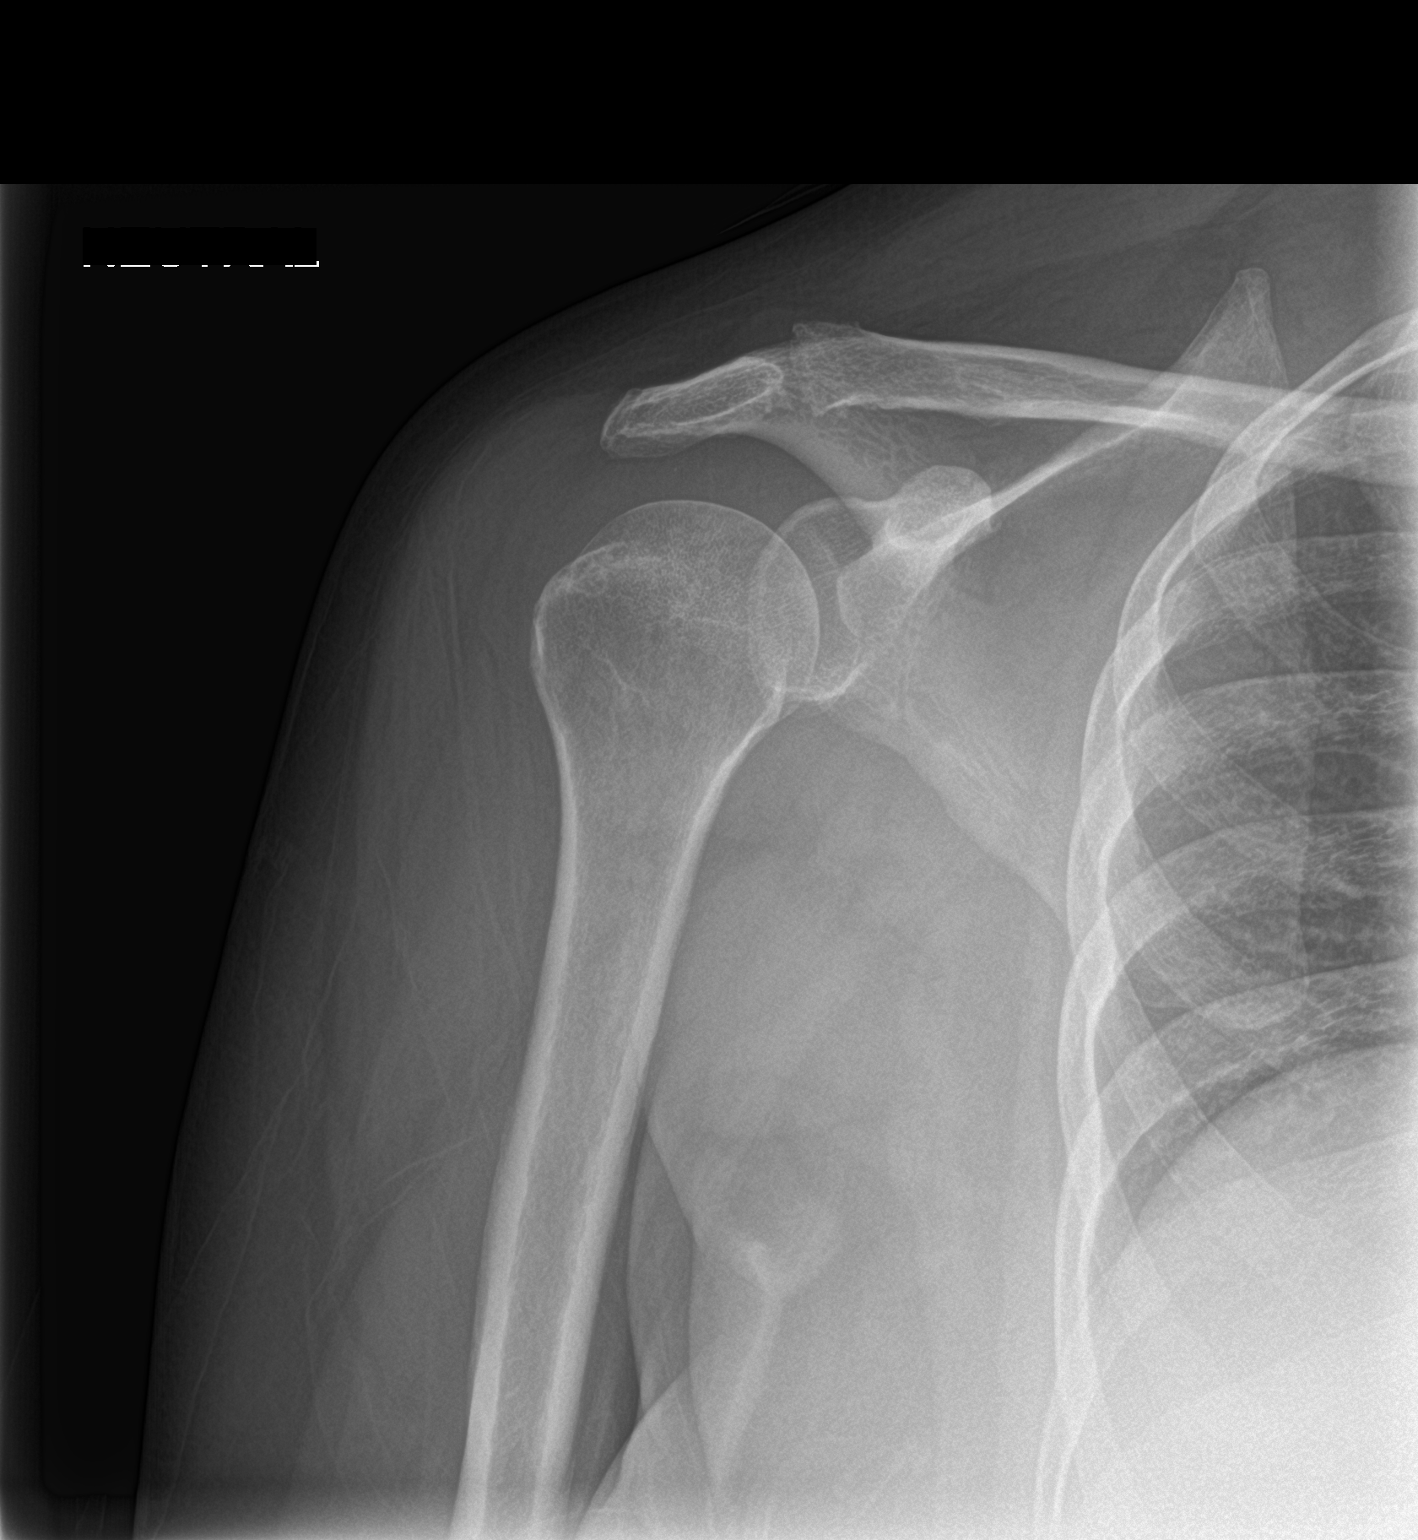

[3 of 3 positions shown; findings below may reference images not displayed]

FINDINGS: There is no evidence of fracture or dislocation. Minimal
degenerative changes are seen involving the right acromioclavicular
joint. Soft tissues are unremarkable.
IMPRESSION: Minimal degenerative joint disease of the right acromioclavicular
joint. No acute abnormality seen.

## 2023-08-29 ENCOUNTER — Emergency Department: Payer: No Typology Code available for payment source

## 2023-08-29 ENCOUNTER — Emergency Department
Admission: EM | Admit: 2023-08-29 | Discharge: 2023-08-29 | Disposition: A | Discharge status: Home / Self Care | Payer: No Typology Code available for payment source | Attending: Emergency Medicine | Admitting: Emergency Medicine

## 2023-08-29 ENCOUNTER — Other Ambulatory Visit: Payer: Self-pay

## 2023-08-29 DIAGNOSIS — M25521 Pain in right elbow: Secondary | ICD-10-CM | POA: Diagnosis present

## 2023-08-29 DIAGNOSIS — I10 Essential (primary) hypertension: Secondary | ICD-10-CM | POA: Insufficient documentation

## 2023-08-29 MED ORDER — NAPROXEN 500 MG PO TABS
500.0000 mg | ORAL_TABLET | Freq: Two times a day (BID) | ORAL | 2 refills | Status: AC
Start: 2023-08-29 — End: 2024-08-28

## 2023-08-29 MED ORDER — DICLOFENAC SODIUM 1 % EX GEL: 2.0000 g | Freq: Two times a day (BID) | CUTANEOUS | 0 refills | Status: AC

## 2023-08-29 MED ORDER — KETOROLAC TROMETHAMINE 15 MG/ML IJ SOLN
30.0000 mg | Freq: Once | INTRAMUSCULAR | Status: AC
  Administered 2023-08-29: 30 mg via INTRAMUSCULAR
  Filled 2023-08-29: qty 2

## 2023-08-29 MED ORDER — ACETAMINOPHEN 500 MG PO TABS
1000.0000 mg | ORAL_TABLET | Freq: Once | ORAL | Status: AC
  Administered 2023-08-29: 1000 mg via ORAL
  Filled 2023-08-29: qty 2

## 2023-08-29 NOTE — Discharge Instructions (Signed)
Take Naprosyn 500 mg twice daily.  Take acetaminophen or Tylenol 650mg  every 6 hours.  Apply diclofenac gel to the affected elbow twice daily.  Follow-up with your orthopedic elbow specialist next week as scheduled.  Thank you for choosing [place] for your health care today!  Please see your primary doctor this week for a follow up appointment.   If you have any new, worsening, or unexpected symptoms call your doctor right away or come back to the emergency department for reevaluation.  It was my pleasure to care for you today.   Daneil Dan Modesto Charon, MD

## 2023-08-29 NOTE — ED Provider Notes (Signed)
[facility] Provider Note    Event Date/Time   First MD Initiated Contact with Patient 08/29/23 [phone]     (approximate)   History   right arm pain   HPI  [name] is a 57 y.o. female   Past medical history of hypertension who presents emerged apartment with right elbow pain.  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  Over the last 2 months she has been worsening medial elbow pain.    She denies fever, chills, or any direct impact to the affected area.    She has had swelling to the elbow as well.  She has no history of blood clots.  She saw her shoulder surgeon who referred her to another orthopedist for assessment of that elbow, but the appointment is next week.  External Medical Documents Reviewed: Surgery note from 2022 for rotator cuff repair      Physical Exam   Triage Vital Signs: ED Triage Vitals  Encounter Vitals Group     BP 08/29/23 0748 (!) 151/96     Systolic BP Percentile --      Diastolic BP Percentile --      Pulse Rate 08/29/23 0748 73     Resp 08/29/23 0748 18     Temp 08/29/23 0748 98 F (36.7 C)     Temp src --      SpO2 08/29/23 0748 100 %     Weight 08/29/23 0756 240 lb 1.3 oz (108.9 kg)     Height 08/29/23 0756 5\' 4"  (1.626 m)     Head Circumference --      Peak Flow --      Pain Score 08/29/23 0748 8     Pain Loc --      Pain Education --      Exclude from Growth Chart --     Most recent vital signs: Vitals:   08/29/23 0748  BP: (!) 151/96  Pulse: 73  Resp: 18  Temp: 98 F (36.7 C)  SpO2: 100%    General: Awake, no distress.  CV:  Good peripheral perfusion.  Resp:  Normal effort.  Abd:  No distention.  Other:  Mild swelling to the medial elbow.  Tenderness to palpation along that area.  No obvious laxity.  Neurovascular  intact, able to range fully.   ED Results / Procedures / Treatments   Labs (all labs ordered are listed, but only abnormal results are displayed) Labs Reviewed - No data to display    RADIOLOGY I independently reviewed and interpreted xr elbow and see no obvious fracture or dislocation I also reviewed radiologist's formal read.   PROCEDURES:  Critical Care performed: No  Procedures   MEDICATIONS ORDERED IN ED: Medications  ketorolac (TORADOL) 15 MG/ML injection 30 mg (30 mg Intramuscular Given 08/29/23 0829)  acetaminophen (TYLENOL) tablet 1,000 mg (1,000 mg Oral Given 08/29/23 0829)    IMPRESSION / MDM / ASSESSMENT AND PLAN / ED COURSE  I reviewed the triage vital signs and the nursing notes.                                Patient's presentation is most consistent with acute presentation with potential threat to life or bodily  function.  Differential diagnosis includes, but is not limited to, septic joint, tendinitis, overuse injury, fracture or dislocation, DVT   The patient is on the cardiac monitor to evaluate for evidence of arrhythmia and/or significant heart rate changes.  MDM:    Most likely overuse injury to the medial side of the right elbow.  There is some swelling, emergent findings today to rule out include fractures or dislocations or DVT.  Will get ultrasound and x-ray.  I considered septic joint but less likely given the time course and able to fully range.  She already has established follow-up next week with orthopedist.  If workup above is unremarkable, will give guidance for pain control Multimodal and have her follow-up with her orthopedist as scheduled.       FINAL CLINICAL IMPRESSION(S) / ED DIAGNOSES   Final diagnoses:  Right elbow pain     Rx / DC Orders   ED Discharge Orders          Ordered    naproxen (NAPROSYN) 500 MG tablet  2 times daily with meals        08/29/23 0834    diclofenac Sodium (VOLTAREN) 1 % GEL  2 times daily         08/29/23 0834             Note:  This document was prepared using Dragon voice recognition software and may include unintentional dictation errors.    Pilar Jarvis, MD 08/29/23 [phone]

## 2023-08-29 NOTE — ED Triage Notes (Signed)
Pt comes with c/o right arm pain. Pt states this started two months ago. Pt states hx of rotator cuff surgery. Pt states swelling and pain in arm with no relief from meds.

## 2024-02-14 ENCOUNTER — Other Ambulatory Visit: Payer: Self-pay

## 2024-02-14 ENCOUNTER — Emergency Department
Admission: EM | Admit: 2024-02-14 | Discharge: 2024-02-14 | Discharge status: Against Medical Advice | Payer: No Typology Code available for payment source | Attending: Emergency Medicine | Admitting: Emergency Medicine

## 2024-02-14 DIAGNOSIS — Z5321 Procedure and treatment not carried out due to patient leaving prior to being seen by health care provider: Secondary | ICD-10-CM | POA: Insufficient documentation

## 2024-02-14 DIAGNOSIS — M546 Pain in thoracic spine: Secondary | ICD-10-CM | POA: Diagnosis present

## 2024-02-14 NOTE — ED Triage Notes (Signed)
 Pt reports "knot" in upper back that is intermittently causing her pain.

## 2024-02-14 NOTE — ED Notes (Signed)
 Patient called x3 with no answer from lobby. Patient not visualized.

## 2024-12-04 ENCOUNTER — Emergency Department

## 2024-12-04 ENCOUNTER — Other Ambulatory Visit: Payer: Self-pay

## 2024-12-04 ENCOUNTER — Emergency Department
Admission: EM | Admit: 2024-12-04 | Discharge: 2024-12-04 | Disposition: A | Discharge status: Home / Self Care | Attending: Emergency Medicine | Admitting: Emergency Medicine

## 2024-12-04 DIAGNOSIS — R350 Frequency of micturition: Secondary | ICD-10-CM | POA: Insufficient documentation

## 2024-12-04 DIAGNOSIS — I10 Essential (primary) hypertension: Secondary | ICD-10-CM | POA: Diagnosis not present

## 2024-12-04 DIAGNOSIS — M549 Dorsalgia, unspecified: Secondary | ICD-10-CM | POA: Diagnosis present

## 2024-12-04 DIAGNOSIS — M545 Low back pain, unspecified: Secondary | ICD-10-CM | POA: Insufficient documentation

## 2024-12-04 DIAGNOSIS — E119 Type 2 diabetes mellitus without complications: Secondary | ICD-10-CM | POA: Diagnosis not present

## 2024-12-04 LAB — URINALYSIS, ROUTINE W REFLEX MICROSCOPIC
Bacteria, UA: NONE SEEN
Bilirubin Urine: NEGATIVE
Glucose, UA: NEGATIVE mg/dL
Hgb urine dipstick: NEGATIVE
Ketones, ur: NEGATIVE mg/dL
Nitrite: NEGATIVE
Protein, ur: 100 mg/dL — AB
Specific Gravity, Urine: 1.011 (ref 1.005–1.030)
pH: 7 (ref 5.0–8.0)

## 2024-12-04 LAB — BASIC METABOLIC PANEL WITH GFR
Anion gap: 9 (ref 5–15)
BUN: 12 mg/dL (ref 6–20)
CO2: 27 mmol/L (ref 22–32)
Calcium: 9.7 mg/dL (ref 8.9–10.3)
Chloride: 102 mmol/L (ref 98–111)
Creatinine, Ser: 0.69 mg/dL (ref 0.44–1.00)
GFR, Estimated: >60 mL/min (ref >60)
Glucose, Bld: 96 mg/dL (ref 70–99)
Potassium: 4 mmol/L (ref 3.5–5.1)
Sodium: 138 mmol/L (ref 135–145)

## 2024-12-04 LAB — CBC
HCT: 41 % (ref 36.0–46.0)
Hemoglobin: 12.7 g/dL (ref 12.0–15.0)
MCH: 22.2 pg — ABNORMAL LOW (ref 26.0–34.0)
MCHC: 31 g/dL (ref 30.0–36.0)
MCV: 71.6 fL — ABNORMAL LOW (ref 80.0–100.0)
Platelets: 243 K/uL (ref 150–400)
RBC: 5.73 MIL/uL — ABNORMAL HIGH (ref 3.87–5.11)
RDW: 15.7 % — ABNORMAL HIGH (ref 11.5–15.5)
WBC: 5.9 K/uL (ref 4.0–10.5)
nRBC: 0 % (ref 0.0–0.2)

## 2024-12-04 LAB — D-DIMER, QUANTITATIVE: D-Dimer, Quant: 0.48 ug{FEU}/mL (ref 0.00–0.50)

## 2024-12-04 MED ORDER — LIDOCAINE 5 % EX PTCH: 1.0000 | MEDICATED_PATCH | Freq: Two times a day (BID) | CUTANEOUS | 0 refills | Status: AC

## 2024-12-04 MED ORDER — ACETAMINOPHEN 325 MG PO TABS
650.0000 mg | ORAL_TABLET | Freq: Once | ORAL | Status: AC
  Administered 2024-12-04: 11:00:00 650 mg via ORAL
  Filled 2024-12-04: qty 2

## 2024-12-04 MED ORDER — LIDOCAINE 5 % EX PTCH
1.0000 | MEDICATED_PATCH | CUTANEOUS | Status: DC
  Administered 2024-12-04: 11:00:00 1 via TRANSDERMAL
  Filled 2024-12-04: qty 1

## 2024-12-04 NOTE — ED Triage Notes (Signed)
 Pt to ED for worsening back pain. Reports currently being treating for PNA and UTI. +urinary frequency. Unable to see imaging or results. RR even and unlabored. NAD noted.

## 2024-12-04 NOTE — ED Provider Notes (Signed)
 [facility] Provider Note    Event Date/Time   First MD Initiated Contact with Patient 12/04/24 0940     (approximate)   History   Back Pain   HPI  [name] is a 58 y.o. female with a past medical history of type 2 diabetes, hypertension, GERD who presents today for evaluation of back pain and cough and urinary frequency.  Patient reports that her symptoms began on Friday and she went to see her primary care provider at [facility] who listened to her lungs and said that maybe she has a touch of pneumonia and started her on azithromycin .  Patient reports that she also told her PCP that she was having urinary frequency, and said that her PCP told her that her urine looked fine but gave her Augmentin as well.  Patient reports that she has been taking both of these without significant change in her symptoms.  She reports that she has pain in her upper back near her shoulder blade.  No chest pain.  No pleurisy.  No hemoptysis.  No leg swelling.  No history of PE or DVT.  No abdominal pain.  Reports that she has a remote history of a kidney stone.  Patient Active Problem List   Diagnosis Date Noted   Anemia 03/11/2019   Sleep apnea 03/11/2019   Bilateral carpal tunnel syndrome 03/30/2015   Menorrhagia 07/27/2011   Diabetes mellitus, type II (HCC) 07/27/2011   Obesity 07/27/2011   Hypertension 07/27/2011   Hypercholesteremia 07/27/2011          Physical Exam   Triage Vital Signs: ED Triage Vitals [12/04/24 0906]  Encounter Vitals Group     BP 109/74     Girls Systolic BP Percentile      Girls Diastolic BP Percentile      Boys Systolic BP Percentile      Boys Diastolic BP Percentile      Pulse Rate (!) 58     Resp 18     Temp 97.9 F (36.6 C)     Temp src      SpO2 100 %     Weight 229 lb (103.9 kg)     Height 5' 4 (1.626 m)     Head Circumference      Peak Flow      Pain Score 9     Pain Loc      Pain Education      Exclude from  Growth Chart     Most recent vital signs: Vitals:   12/04/24 0906 12/04/24 1216  BP: 109/74 112/70  Pulse: (!) 58 (!) 56  Resp: 18 17  Temp: 97.9 F (36.6 C) 97.9 F (36.6 C)  SpO2: 100% 99%    Physical Exam Vitals and nursing note reviewed.  Constitutional:      General: Awake and alert. No acute distress.    Appearance: Normal appearance. The patient is normal weight.  HENT:     Head: Normocephalic and atraumatic.     Mouth: Mucous membranes are moist.  Eyes:     General: PERRL. Normal EOMs        Right eye: No discharge.        Left eye: No discharge.     Conjunctiva/sclera: Conjunctivae normal.  Cardiovascular:     Rate and Rhythm: Normal rate and regular rhythm.     Pulses: Normal pulses.  Pulmonary:     Effort: Pulmonary effort is normal. No respiratory distress.  Breath sounds: Normal breath sounds.  Abdominal:     Abdomen is soft. There is no abdominal tenderness. No rebound or guarding. No distention.  No CVA tenderness Musculoskeletal:        General: No swelling. Normal range of motion.     Cervical back: Normal range of motion and neck supple. Mild tenderness palpation to left scapular area.  No rash noted. Skin:    General: Skin is warm and dry.     Capillary Refill: Capillary refill takes less than 2 seconds.     Findings: No rash.  Neurological:     Mental Status: The patient is awake and alert.      ED Results / Procedures / Treatments   Labs (all labs ordered are listed, but only abnormal results are displayed) Labs Reviewed  CBC - Abnormal; Notable for the following components:      Result Value   RBC 5.73 (*)    MCV 71.6 (*)    MCH 22.2 (*)    RDW 15.7 (*)    All other components within normal limits  URINALYSIS, ROUTINE W REFLEX MICROSCOPIC - Abnormal; Notable for the following components:   Color, Urine STRAW (*)    APPearance CLEAR (*)    Protein, ur 100 (*)    Leukocytes,Ua TRACE (*)    All other components within normal  limits  BASIC METABOLIC PANEL WITH GFR  D-DIMER, QUANTITATIVE     EKG     RADIOLOGY I independently reviewed and interpreted imaging and agree with radiologists findings.     PROCEDURES:  Critical Care performed:   Procedures   MEDICATIONS ORDERED IN ED: Medications  lidocaine  (LIDODERM ) 5 % 1 patch (1 patch Transdermal Patch Applied 12/04/24 1103)  acetaminophen  (TYLENOL ) tablet 650 mg (650 mg Oral Given 12/04/24 1103)     IMPRESSION / MDM / ASSESSMENT AND PLAN / ED COURSE  I reviewed the triage vital signs and the nursing notes.   Differential diagnosis includes, but is not limited to, urinary tract infection, pyelonephritis, ureteral colic, muscle spasm, pneumonia.  Patient is awake and alert, hemodynamically stable and afebrile.  She is nontoxic in appearance.  No reproducible abdominal pain or CVA tenderness on exam.  Further workup is indicated.  Given that she is being treated for pneumonia and urinary tract infection, labs, chest x-ray obtained.  Urinalysis reveals leukocytes but no bacteria, though given her urinary urgency it is possible that this is a mass.  CT renal stone obtained given that she had pain in that area before, to ensure no concurrent kidney stone..  This was negative for any acute intra-abdominal or intrapelvic findings.  X-ray of her chest is negative for evidence of pneumonia.  Symptoms appear to be most consistent with musculoskeletal etiology given the reproduction of symptoms with palpation of the area.  D-dimer obtained given upper back pain and cough, this was negative.  No tachycardia or hypoxia, no clinical signs or symptoms of DVT, no hemoptysis.  No pleurisy.  I do not suspect pulmonary embolism today.  We discussed return precautions and outpatient follow-up.  Patient understands and agrees with plan.  Discharged in stable condition.   Patient's presentation is most consistent with acute complicated illness / injury requiring  diagnostic workup.     FINAL CLINICAL IMPRESSION(S) / ED DIAGNOSES   Final diagnoses:  Low back pain without sciatica, unspecified back pain laterality, unspecified chronicity  Urinary frequency     Rx / DC Orders   ED Discharge Orders  Ordered    lidocaine  (LIDODERM ) 5 %  Every 12 hours        12/04/24 1203             Note:  This document was prepared using Dragon voice recognition software and may include unintentional dictation errors.   Aarnav Steagall E, PA-C 12/04/24 1255    Dorothyann Drivers, MD 12/04/24 1302

## 2024-12-04 NOTE — ED Notes (Signed)
 Pt reports that she has been having left shoulder blade pain since Friday, has been taking ibuprofen  at home, states that she doesn't think she did anything to injure herself, states that laying back on it or sitting against it makes it hurt worse, no rash noted to the skin

## 2024-12-04 NOTE — Discharge Instructions (Signed)
 Your blood work, chest x-ray, and CT scans are reassuring today.  You may complete the antibiotics that were given to you before.  You may also use the Lidoderm  patches to help with your symptoms.  Please return for any new, worsening, or changing symptoms or other concerns.  It was a pleasure caring for you today.

## 2024-12-15 NOTE — ED Provider Notes (Signed)
[facility] Emergency Department Provider Note   ED Clinical Impression   Final diagnoses:  Acute pain of left shoulder (Primary)    Impression, Medical Decision Making, ED Course   Impression: 58 y.o. female with PMH most significant for carpal tunnel, diabetes, hypertension, GERD who presents with left shoulder pain for the past 3 weeks as described below.   DDx/MDM: Patient is a 58 year old female with history of carpal tunnel, diabetes, hypertension, GERD who presents with left shoulder pain for the past 3 weeks. On chart review, patient had negative D-dimer on 12/04/2024.  CT abdomen and pelvis was also completed at that time and did not show any acute findings.  She also had a chest x-ray that was negative for pneumonia. On exam, patient is nontoxic-appearing.  Vital signs within normal limits apart from slightly elevated blood pressure 162/92.  There is point tenderness to the left trapezius muscle.  Patient has 5 out of 5 muscle strength for the bilateral upper extremities.  There is slightly decreased range of motion secondary to pain of the left shoulder.  Sensation is equal and intact.  Negative Spurling's test.  No midline spinal tenderness.  Differential diagnosis includes but is not limited to musculoskeletal injury, PE, cervical radiculopathy, rotator cuff tendinitis, bursitis, impingement, osteoarthritis.  Low suspicion for cervical radiculopathy as patient has no midline spinal tenderness and negative Spurling's test.  Low suspicion for PE given patient has no shortness of breath and symptoms are not similar to those of PE.  Will give Tylenol , Toradol , lidocaine  patch, and trigger point 3 mL 1% lidocaine  injection.   On reevaluation, patient states her symptoms have significantly improved.  I do believe symptoms are musculoskeletal in nature.  She will continue taking Tylenol  and ibuprofen  at home for pain control.  Have also sent a prescription of lidocaine  patches to her pharmacy.   She will follow-up with her PCP in the next 2 to 3 days.  She was given strict return precautions to the ED.  Discharge home.  No orders of the defined types were placed in this encounter.   ED Course as of 12/15/24 1656  Sun Dec 15, 2024  0912 On reevaluation, patient reports her symptoms have significantly improved.  9084 Patient tolerated trigger point injection well.    MDM Elements   I have reviewed recent and relevant previous record, including: Inpatient notes - ED note from 12/04/2024     ____________________________________________  The case was discussed with the attending physician, who is in agreement with the above assessment and plan.    History   Chief Complaint Chief Complaint  Patient presents with   Shoulder Pain    HPI  History of Present Illness ANYJAH ROUNDTREE is a 58 year old female who presents with persistent shoulder and back pain.  She has been experiencing shoulder pain radiating into her back and neck for approximately three weeks. The pain initially began in her lower back and has since migrated upwards.  She describes the pain as aching and constant.  Denies any numbness or weakness in her arm.  Does note decreased range of motion secondary to the pain.  The pain began around December 02, 2024, initially presenting as lower back pain. She was prescribed two types of antibiotics and ibuprofen  800 mg, which did not alleviate her symptoms.  Patient reports she was seen by [facility] on 12/04/2024 where she had negative imaging at that time.  She later visited an urgent care where she received a  prednisone  shot and 5-day course of prednisone  which she finished yesterday. 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  No cough, shortness of breath, chest pain, back pain, congestion, burning during urination, blood in urine, abdominal pain,  nausea, vomiting, fever, chills, or recent injuries. She has a history of carpal tunnel syndrome but states that her current symptoms do not resemble those experienced with carpal tunnel. No recent headaches or acute changes in vision.     Outside Historian(s): I have obtained additional history/collateral from none.  Past Medical History[1]  Past Surgical History[2]  Active Medications[3]   Allergies[4]  Family History[5]  Short Social History[6]   Physical Exam   VITAL SIGNS:    Vitals:   12/15/24 0656  BP: (!) 162/92  Pulse: 75  Resp: 18  Temp: 36.4 C (97.5 F)  TempSrc: Oral  SpO2: 99%  Weight: (!) 104 kg (229 lb 4.5 oz)  Height: 162.6 cm (5' 4)    Constitutional: Alert and oriented. No acute distress. Eyes: Conjunctivae are normal. HEENT: Normocephalic and atraumatic. Conjunctivae clear. No congestion. Moist mucous membranes.  Cardiovascular: Rate as above, regular rhythm. Normal and symmetric distal pulses. Brisk capillary refill. Normal skin turgor. Respiratory: Normal respiratory effort. Breath sounds are normal. There are no wheezing or crackles heard. Gastrointestinal: Soft, non-distended, non-tender. Genitourinary: Deferred. Musculoskeletal: Non-tender with normal range of motion in all extremities. + Point tenderness to the left trapezius muscle.  5 out of 5 muscle strength bilaterally in upper and lower extremities.  No midline spinal tenderness, no step-offs or deformities.  There is slight decreased range of motion of the left shoulder secondary to discomfort. Neurologic: Normal speech and language. No gross focal neurologic deficits are appreciated. Patient is moving all extremities equally, face is symmetric at rest and with speech. Skin: Skin is warm, dry and intact. No rash noted. Psychiatric: Mood and affect are normal. Speech and behavior are normal.   Radiology   No orders to display    Pertinent labs & imaging results that were available  during my care of the patient were independently interpreted by me and considered in my medical decision making (see chart for details).  Portions of this record have been created using Scientist, clinical (histocompatibility and immunogenetics). Dictation errors have been sought, but may not have been identified and corrected.        [1] Past Medical History: Diagnosis Date   CTS (carpal tunnel syndrome)    Diabetes mellitus    (CMS-HCC)    GERD (gastroesophageal reflux disease)    Hard to intubate    Hypertension   [2] Past Surgical History: Procedure Laterality Date   BREAST BIOPSY Right    about 7 years ago   CARPAL TUNNEL RELEASE     FOOT SURGERY     HAND SURGERY     KNEE SURGERY     PR HYSTEROSCOPY,W/ENDO BX N/A 07/22/2020   Procedure: HYSTEROSCOPY, SURGICAL; WITH SAMPLING (BIOPSY) OF ENDOMETRIUM &/OR POLYPECTOMY, W/WO D&C;  Surgeon: Dale Fairy Daub, MD;  Location: [facility] OR [facility];  Service: [facility] Primary Gynecology   PR KNEE SCOPE,MED/LAT MENISECTOMY Left 08/05/2020   Procedure: ARTHROSCOPY, KNEE; W/MENISECT(MED/LAT, INCL MENISCAL SHAVE) W/DEBRIDE/SHAVE ARTICULAR CART(CHONDROPLASTY);  Surgeon: Alease Carrie Oyster, MD;  Location: [facility] OR [facility];  Service: Orthopedics   PR REVISE MEDIAN N/CARPAL TUNNEL SURG Right 09/15/2015   Procedure: NEUROPLASTY AND/OR TRANSPOSITION; MEDIAN NERVE AT CARPAL TUNNEL;  Surgeon: Robynn  Draeger, MD;  Location: [facility] OR [facility];  Service: Orthopedics   PR REVISE MEDIAN N/CARPAL TUNNEL SURG Left 09/27/2016   Procedure: NEUROPLASTY AND/OR TRANSPOSITION; MEDIAN NERVE AT CARPAL TUNNEL;  Surgeon: Robynn Blush Draeger, MD;  Location: [facility] OR [facility];  Service: Orthopedics   PR RIGHT HEART CATH O2 SATURATION & CARDIAC OUTPUT N/A 11/07/2022   Procedure: Right Heart Catheterization;  Surgeon: Eloy Fairy Rigg, MD;  Location: [facility] CATH;  Service: Cardiology   PR SHLDR ARTHROSCOP,SURG,W/ROTAT CUFF REPR Right 09/22/2021   Procedure: ARTHROSCOPY, SHOULDER, SURGICAL; WITH  ROTATOR CUFF REPAIR;  Surgeon: Alease Carrie Oyster, MD;  Location: [facility] OR [facility];  Service: Orthopedics   SHOULDER ARTHROSCOPY     TUBAL LIGATION    [3] No current facility-administered medications for this encounter.   Current Outpatient Medications  Medication Sig Dispense Refill   ACCU-CHEK GUIDE GLUCOSE METER Misc USE TO CHECK BLOOD SUGARS EVERY DAY     ACCU-CHEK SOFTCLIX LANCETS lancets USE TO CHECK BLOOD SUGAR ONCE DAILY     albuterol  HFA 90 mcg/actuation inhaler INHALE 2 PUFFS BY MOUTH EVERY 4 HOURS AS NEEDED FOR WHEEZING OR SHORTNESS OF BREATH     ibuprofen  (MOTRIN ) 800 MG tablet TAKE 1 TABLET BY MOUTH EVERY 8 HOURS WITH FOOD AS NEEDED FOR PAIN     lidocaine  (LIDODERM ) 5 % patch Place 1 patch on the skin daily for 3 days. Apply to affected area for 12 hours only each day (then remove patch) 3 patch 0   lisinopril-hydrochlorothiazide (PRINZIDE,ZESTORETIC) 20-25 mg per tablet   0   melatonin 5 mg tablet Take 1 tablet (5 mg total) by mouth nightly as needed.     methocarbamol (ROBAXIN) 500 MG tablet TAKE 1 TO 2 TABLETS BY MOUTH EVERY 6 HOURS AS NEEDED FOR MUSCLE SPASM     pantoprazole  (PROTONIX ) 40 MG tablet     [4] Allergies Allergen Reactions   Sulfa (Sulfonamide Antibiotics) Hives  [5] Family History Problem Relation Age of Onset   No Known Problems Mother    No Known Problems Father    No Known Problems Sister    No Known Problems Brother    No Known Problems Maternal Grandmother    No Known Problems Maternal Grandfather    No Known Problems Paternal Grandmother    No Known Problems Paternal Grandfather    No Known Problems Maternal Aunt    No Known Problems Maternal Uncle    No Known Problems Paternal Aunt    No Known Problems Paternal Uncle    Anesthesia problems Neg Hx    Broken bones Neg Hx    Cancer Neg Hx    Clotting disorder Neg Hx    Collagen disease Neg Hx    Diabetes Neg Hx    Dislocations Neg Hx    Fibromyalgia Neg Hx     Gout Neg Hx    Hemophilia Neg Hx    Osteoporosis Neg Hx    Rheumatologic disease Neg Hx    Scoliosis Neg Hx    Severe sprains Neg Hx    Sickle cell anemia Neg Hx    Spinal Compression Fracture Neg Hx    Breast cancer Neg Hx   [6] Social History Tobacco Use   Smoking status: Never   Smokeless tobacco: Never  Substance Use Topics   Alcohol use: Yes    Alcohol/week: 2.0 standard drinks of alcohol    Types: 2 Shots of liquor per week   Drug use: No   Herminio Lauraine MATSU, DO Resident  12/15/24 1700 ° °

## 2024-12-17 ENCOUNTER — Emergency Department

## 2024-12-17 ENCOUNTER — Emergency Department
Admission: EM | Admit: 2024-12-17 | Discharge: 2024-12-17 | Disposition: A | Discharge status: Home / Self Care | Attending: Emergency Medicine | Admitting: Emergency Medicine

## 2024-12-17 ENCOUNTER — Other Ambulatory Visit: Payer: Self-pay

## 2024-12-17 DIAGNOSIS — M5412 Radiculopathy, cervical region: Secondary | ICD-10-CM | POA: Diagnosis not present

## 2024-12-17 DIAGNOSIS — M542 Cervicalgia: Secondary | ICD-10-CM | POA: Diagnosis present

## 2024-12-17 MED ORDER — PREDNISONE 20 MG PO TABS: 40.0000 mg | ORAL_TABLET | Freq: Every day | ORAL | 0 refills | Status: AC

## 2024-12-17 MED ORDER — NAPROXEN 500 MG PO TABS: 500.0000 mg | ORAL_TABLET | Freq: Two times a day (BID) | ORAL | 0 refills | Status: AC

## 2024-12-17 MED ORDER — LIDOCAINE 5 % EX PTCH
1.0000 | MEDICATED_PATCH | CUTANEOUS | Status: DC
  Administered 2024-12-17: 1 via TRANSDERMAL
  Filled 2024-12-17: qty 1

## 2024-12-17 MED ORDER — KETOROLAC TROMETHAMINE 15 MG/ML IJ SOLN
15.0000 mg | Freq: Once | INTRAMUSCULAR | Status: AC
  Administered 2024-12-17: 15 mg via INTRAMUSCULAR
  Filled 2024-12-17: qty 1

## 2024-12-17 NOTE — ED Provider Notes (Signed)
 "  [facility] Provider Note    Event Date/Time   First MD Initiated Contact with Patient 12/17/24 [phone]     (approximate)   History   Shoulder Pain and Back Pain   HPI  [name] is a 58 y.o. female who presents today for evaluation of left sided neck and arm pain that has been ongoing for the past several weeks.  Patient reports that her pain radiates down to her mid left upper arm, no paresthesias or weakness in her arm.  No decreased grip strength.  No chest pain or shortness of breath.  Patient Active Problem List   Diagnosis Date Noted   Anemia 03/11/2019   Sleep apnea 03/11/2019   Bilateral carpal tunnel syndrome 03/30/2015   Menorrhagia 07/27/2011   Diabetes mellitus, type II (HCC) 07/27/2011   Obesity 07/27/2011   Hypertension 07/27/2011   Hypercholesteremia 07/27/2011          Physical Exam   Triage Vital Signs: ED Triage Vitals [12/17/24 0633]  Encounter Vitals Group     BP (!) 154/92     Girls Systolic BP Percentile      Girls Diastolic BP Percentile      Boys Systolic BP Percentile      Boys Diastolic BP Percentile      Pulse Rate 72     Resp 17     Temp 98.4 F (36.9 C)     Temp src      SpO2 95 %     Weight 229 lb (103.9 kg)     Height 5' 4 (1.626 m)     Head Circumference      Peak Flow      Pain Score 10     Pain Loc      Pain Education      Exclude from Growth Chart     Most recent vital signs: Vitals:   12/17/24 0633  BP: (!) 154/92  Pulse: 72  Resp: 17  Temp: 98.4 F (36.9 C)  SpO2: 95%    Physical Exam Vitals and nursing note reviewed.  Constitutional:      General: Awake and alert. No acute distress.    Appearance: Normal appearance. The patient is normal weight.  HENT:     Head: Normocephalic and atraumatic.     Mouth: Mucous membranes are moist.  Eyes:     General: PERRL. Normal EOMs        Right eye: No discharge.        Left eye: No discharge.     Conjunctiva/sclera: Conjunctivae  normal.  Cardiovascular:     Rate and Rhythm: Normal rate and regular rhythm.     Pulses: Normal pulses.  Pulmonary:     Effort: Pulmonary effort is normal. No respiratory distress.     Breath sounds: Normal breath sounds.  Abdominal:     Abdomen is soft. There is no abdominal tenderness. No rebound or guarding. No distention. Musculoskeletal:        General: No swelling. Normal range of motion.     Cervical back: Normal range of motion and neck supple. No midline cervical spine tenderness.  Full range of motion of neck.  Positive Spurling test to the left.  Negative Lhermitte sign.  Normal strength and sensation in bilateral upper extremities. Normal grip strength bilaterally.  Normal intrinsic muscle function of the hand bilaterally.  Normal radial pulses bilaterally. Skin:    General: Skin is warm and dry.  Capillary Refill: Capillary refill takes less than 2 seconds.     Findings: No rash.  Neurological:     Mental Status: The patient is awake and alert.      ED Results / Procedures / Treatments   Labs (all labs ordered are listed, but only abnormal results are displayed) Labs Reviewed - No data to display   EKG     RADIOLOGY I independently reviewed and interpreted imaging and agree with radiologists findings.     PROCEDURES:  Critical Care performed:   Procedures   MEDICATIONS ORDERED IN ED: Medications  lidocaine  (LIDODERM ) 5 % 1 patch (1 patch Transdermal Patch Applied 12/17/24 0808)  ketorolac  (TORADOL ) 15 MG/ML injection 15 mg (15 mg Intramuscular Given 12/17/24 0808)     IMPRESSION / MDM / ASSESSMENT AND PLAN / ED COURSE  I reviewed the triage vital signs and the nursing notes.   Differential diagnosis includes, but is not limited to, cervical radiculopathy, musculoskeletal etiology, muscle spasm.  I reviewed the patient's chart.  Patient has been seen previously for the same complaint.  Most recently seen on 12/28, 2 days ago at [facility]  at which time she was told that she likely had a musculoskeletal problem.  She was given a trigger point injection.  Patient is awake and alert, hemodynamically stable and afebrile.  She has no chest pain, shortness of breath, or upper back pain to suggest pulmonary embolism.  She has normal strength and sensation in bilateral upper extremities, normal intrinsic muscle function bilaterally, not consistent with cord compression.  No gait instability, no bowel or bladder dysfunction or saddle anesthesia, not consistent with cauda equina.    CT scan of her neck does reveal degenerative disc disease at C4-C5, C5-C6, and C6-C7, and I do suspect cervical radiculopathy.  She was started on a short course of prednisone  and naproxen , and given Lidoderm  patches.  She was instructed to follow-up with Dr. Claudene with neurosurgery.  We discussed return precautions in the meantime.  Patient understands and agrees with plan.  She was discharged in stable condition.   Patient's presentation is most consistent with acute complicated illness / injury requiring diagnostic workup.     FINAL CLINICAL IMPRESSION(S) / ED DIAGNOSES   Final diagnoses:  Cervical radiculopathy     Rx / DC Orders   ED Discharge Orders          Ordered    predniSONE  (DELTASONE ) 20 MG tablet  Daily with breakfast        12/17/24 0859    naproxen  (NAPROSYN ) 500 MG tablet  2 times daily with meals        12/17/24 0859             Note:  This document was prepared using Dragon voice recognition software and may include unintentional dictation errors.   Tremel Setters E, PA-C 12/17/24 1021    Suzanne Kirsch, MD 12/17/24 [phone]  "

## 2024-12-17 NOTE — ED Notes (Signed)
 See triage note  Presents with left posterior shoulder pain  States pain moves into left arm  Has been seen several times for same  States pain is not any better Pain started 3 weeks ago

## 2024-12-17 NOTE — Discharge Instructions (Addendum)
 You may take the medication as prescribed.  Please follow-up with the spine surgeon if your symptoms persist.  Please return for any new, worsening, or changing symptoms or other concerns.  It was a pleasure caring for you today.

## 2024-12-17 NOTE — ED Triage Notes (Signed)
 Pt reports she has had left lower back pain that radiates into left shoulder. Pt states this has been going on for 3 weeks and she has seen dr 6 times for same, pt reports she has gotten different answers to what is going on each time but nothing has relieved her pain. Pt denies injuring arm or back.

## 2024-12-23 ENCOUNTER — Encounter: Payer: Self-pay | Admitting: Physician Assistant

## 2024-12-23 ENCOUNTER — Ambulatory Visit: Admitting: Physician Assistant

## 2024-12-23 VITALS — BP 118/76 | Ht 64.0 in | Wt 225.0 lb

## 2024-12-23 DIAGNOSIS — M5010 Cervical disc disorder with radiculopathy, unspecified cervical region: Secondary | ICD-10-CM | POA: Diagnosis not present

## 2024-12-23 MED ORDER — GABAPENTIN 100 MG PO CAPS: 100.0000 mg | ORAL_CAPSULE | Freq: Three times a day (TID) | ORAL | 1 refills | Status: AC

## 2024-12-23 MED ORDER — DIAZEPAM 5 MG PO TABS: ORAL_TABLET | ORAL | 0 refills | Status: AC

## 2024-12-23 NOTE — Progress Notes (Unsigned)
 "  Referring Physician:  Era Raisin, NP [address] [place],  [place] [address]  Primary Physician:  Era Raisin, NP  History of Present Illness: 12/23/2024 [name] is here today with a chief complaint of neck pain radiating to left upper extremity down to the of her elbow.  She has also noticed that at time her fingers will become numb and she has had issues with doing her buttons and opening jars feels as though her fine motor skills have changed.  The numbness and tingling is worse when sleeping and upon waking.  She is been taking naproxen  without relief.  This started while she is at work, but denies any significant inciting event although this started suddenly approximately 1 month ago.  Denies any changes to her gait any incontinence to bowel or bladder.     Duration: sudden onset on 11/22/2024 Weakness: none Bowel/Bladder Dysfunction: none  Conservative measures:  Physical therapy: has not participated in  Multimodal medical therapy including regular antiinflammatories:  Naproxen , Prednisone , Lidocaine  patch, Ibuprofen , Tramadol ,  Injections: no epidural steroid injections  Past Surgery: no past spine surgeries   The symptoms are causing a significant impact on the patient's life.   Review of Systems:  A 10 point review of systems is negative, except for the pertinent positives and negatives detailed in the HPI.  Past Medical History: Past Medical History:  Diagnosis Date   Anemia    Diabetes mellitus    adult onset at age of 32   Hyperlipidemia    Hypertension    Sleep apnea     Past Surgical History: Past Surgical History:  Procedure Laterality Date   ROTATOR CUFF REPAIR Right    TUBAL LIGATION      Allergies: Allergies as of 12/23/2024 - Review Complete 12/17/2024  Allergen Reaction Noted   Sulfasalazine Hives 07/27/2011   Sulfa antibiotics Hives 07/27/2011    Medications: Outpatient Encounter Medications as of 12/23/2024   Medication Sig   albuterol  (VENTOLIN  HFA) 108 (90 Base) MCG/ACT inhaler Inhale 2 puffs into the lungs every 6 (six) hours as needed for wheezing or shortness of breath.   benzonatate  (TESSALON ) 100 MG capsule Take 1 capsule (100 mg total) by mouth 3 (three) times daily as needed.   clotrimazole -betamethasone  (LOTRISONE ) cream Apply to affected area 2 times daily prn for 10 days   lisinopril-hydrochlorothiazide (PRINZIDE,ZESTORETIC) 20-25 MG tablet Take 1 tablet by mouth daily.     naproxen  (NAPROSYN ) 500 MG tablet Take 1 tablet (500 mg total) by mouth 2 (two) times daily with a meal for 7 days.   pantoprazole  (PROTONIX ) 40 MG tablet    No facility-administered encounter medications on file as of 12/23/2024.    Social History: Social History[1]  Family Medical History: No family history on file.  Physical Examination: @VITALWITHPAIN @  General: Patient is well developed, well nourished, calm, collected, and in no apparent distress. Attention to examination is appropriate.  Psychiatric: Patient is non-anxious.  Head:  Pupils equal, round, and reactive to light.  ENT:  Oral mucosa appears well hydrated.  Neck:   Supple.  Respiratory: Patient is breathing without any difficulty.  Extremities: No edema.  Vascular: Palpable dorsal pedal pulses.  Skin:   On exposed skin, there are no abnormal skin lesions.  NEUROLOGICAL:     Awake, alert, oriented to person, place, and time.  Speech is clear and fluent. Fund of knowledge is appropriate.   Cranial Nerves: Pupils equal round and reactive to light.  Facial  tone is symmetric.   ROM of spine: Some tenderness palpation of cervical paraspinals.   Strength: Side Biceps Triceps Deltoid Interossei Grip Wrist Ext. Wrist Flex.  R 5 5 5 5 5 5 5   L 5 3 5 5  4+ 5 5  2  in RUE, 2+ in LUE- difficult to obtain tricep reflex bilaterally   Medical Decision Making  Imaging: EXAM: CT CERVICAL SPINE WITHOUT CONTRAST 12/17/2024 08:14:41 AM    TECHNIQUE: CT of the cervical spine was performed without the administration of intravenous contrast. Multiplanar reformatted images are provided for review. Automated exposure control, iterative reconstruction, and/or weight based adjustment of the mA/kV was utilized to reduce the radiation dose to as low as reasonably achievable.   COMPARISON: None available.   CLINICAL HISTORY: Cervical radiculopathy, no red flags.   FINDINGS:   BONES AND ALIGNMENT: There is mild reversal of the normal cervical lordosis. No acute fracture or traumatic malalignment.   DEGENERATIVE CHANGES: There is mild chronic degenerative disc disease at C4-C5, C5-C6 and C6-C7.   SOFT TISSUES: No prevertebral soft tissue swelling.   IMPRESSION: 1. Mild reversal of the normal cervical lordosis. 2. Mild chronic degenerative disc disease at C4-5, C5-6, and C6-7.  I have personally reviewed the images and agree with the above interpretation.  Assessment and Plan: [name] is a pleasant 59 y.o. female with acute cervical radiculopathy to her left upper extremity with associated significant tricep weakness.  Concern for significant foraminal stenosis.  Plan is as follows:  -X-rays today with flexion-extension to evaluate for listhesis - Given significant weakness, would like MRI of cervical spine as soon as possible.  Valium  given for claustrophobia. - Discussed patient is neuropathic pain.  Patient was given gabapentin .  The risks and benefits of this medication were discussed at length. - Referral placed for physical therapy. - Will review results once complete.    Thank you for involving me in the care of this patient.   Lyle Decamp, PA-C Dept. of Neurosurgery      [1]  Social History Tobacco Use   Smoking status: Never   Smokeless tobacco: Never  Vaping Use   Vaping status: Never Used  Substance Use Topics   Alcohol use: Yes    Comment: occasional   Drug use: No   "

## 2024-12-24 ENCOUNTER — Encounter: Payer: Self-pay | Admitting: Physician Assistant

## 2024-12-25 ENCOUNTER — Telehealth: Payer: Self-pay | Admitting: Physician Assistant

## 2024-12-25 NOTE — Telephone Encounter (Signed)
 Spoke with the patient and she is asking if you could up her dose of Gabapentin ? She is taking the 100mg  3 times. This is only helping 2-3 hours.  What should she increase it too now?

## 2024-12-25 NOTE — Telephone Encounter (Signed)
 Left a message for the patient to call us back

## 2024-12-25 NOTE — Telephone Encounter (Signed)
 Patient called to let our office know that the Gabapentin  is not helping with her pain. She states that she gets maybe 2-3 hours of relief before her pain returns. She would like to know if something else can be sent in for her.   [facility] in [place]

## 2024-12-26 ENCOUNTER — Other Ambulatory Visit

## 2024-12-26 NOTE — Telephone Encounter (Signed)
 Patient has been notified about increasing her Gabapentin  and to call us  back in a few days if it is not helping. She verbalized understanding.

## 2024-12-27 ENCOUNTER — Encounter: Payer: Self-pay | Admitting: Emergency Medicine

## 2024-12-27 ENCOUNTER — Emergency Department
Admission: EM | Admit: 2024-12-27 | Discharge: 2024-12-27 | Disposition: A | Discharge status: Home / Self Care | Attending: Emergency Medicine | Admitting: Emergency Medicine

## 2024-12-27 ENCOUNTER — Other Ambulatory Visit: Payer: Self-pay

## 2024-12-27 DIAGNOSIS — I10 Essential (primary) hypertension: Secondary | ICD-10-CM | POA: Diagnosis not present

## 2024-12-27 DIAGNOSIS — M5412 Radiculopathy, cervical region: Secondary | ICD-10-CM | POA: Insufficient documentation

## 2024-12-27 DIAGNOSIS — M79602 Pain in left arm: Secondary | ICD-10-CM | POA: Diagnosis present

## 2024-12-27 DIAGNOSIS — E119 Type 2 diabetes mellitus without complications: Secondary | ICD-10-CM | POA: Insufficient documentation

## 2024-12-27 MED ORDER — HYDROCODONE-ACETAMINOPHEN 5-325 MG PO TABS: 1.0000 | ORAL_TABLET | Freq: Four times a day (QID) | ORAL | 0 refills | Status: AC | PRN

## 2024-12-27 NOTE — ED Provider Notes (Signed)
" ° °  [facility] Provider Note    Event Date/Time   First MD Initiated Contact with Patient 12/27/24 1320     (approximate)   History   Arm Pain   HPI  AADVIKA KONEN is a 59 y.o. female with history of diabetes type 2, hypertension, anemia and as listed in EMR presents to the emergency department for treatment and evaluation of left arm pain since December 6. She was scheduled for an MRI yesterday but reports the machine was broken. MRI rescheduled for next week. She has been taking gabapentin  without relief.  Pain is in the forearm and goes to the shoulder.     Physical Exam    Vitals:   12/27/24 1150 12/27/24 1354  BP: (!) 121/92 120/88  Pulse: (!) 107 98  Resp: 16 17  Temp: 98.6 F (37 C) 98.4 F (36.9 C)  SpO2: 98% 99%    General: Awake, no distress.  CV:  Good peripheral perfusion.  Resp:  Normal effort.  Abd:  No distention.  Other:  FROM of left upper extremity   ED Results / Procedures / Treatments   Labs (all labs ordered are listed, but only abnormal results are displayed)  Labs Reviewed - No data to display   EKG  Not indicated.   RADIOLOGY  Image and radiology report reviewed and interpreted by me. Radiology report consistent with the same.  Not indicated  PROCEDURES:  Critical Care performed: No  Procedures   MEDICATIONS ORDERED IN ED:  Medications - No data to display   IMPRESSION / MDM / ASSESSMENT AND PLAN / ED COURSE   I have reviewed the triage note and vital signs. Vital signs stable   Differential diagnosis includes, but is not limited to, cervical radiculopathy, shoulder impingement, tendonitis   Patient's presentation is most consistent with acute, uncomplicated illness.  58 year old female presents for treatment of chronic left  upper extremity pain. Pain is from mid forearm and radiates up into shoulder, neck, scapula, and occasionally in chest wall. She has had 2 rounds of steroid, tylenol ,  ibuprofen , and now gabapentin . She has been to neurology and MRI of the cervical spine ordered, but not yet completed. Pain is preventing her from sleeping and she is having a difficult time at work.  Plan today will be to provide her a short course of Norco and have her continue the gabapentin . She is to attend her upcoming appointment for MRI and follow up with neurology.      FINAL CLINICAL IMPRESSION(S) / ED DIAGNOSES   Final diagnoses:  Cervical radiculopathy     Rx / DC Orders   ED Discharge Orders          Ordered    HYDROcodone -acetaminophen  (NORCO/VICODIN) 5-325 MG tablet  Every 6 hours PRN        12/27/24 1349             Note:  This document was prepared using Dragon voice recognition software and may include unintentional dictation errors.   Herlinda Kirk NOVAK, FNP 12/28/24 9292    Suzanne Kirsch, MD 01/03/25 9186  "

## 2024-12-27 NOTE — ED Triage Notes (Signed)
 Pt reports left arm pain since 12/6. States she was scheduled for MRI yesterday but machine was broken. Pt taken gabapentin  with no relief. Pain to forearm and goes to shoulder.

## 2024-12-27 NOTE — Discharge Instructions (Signed)
 Please keep your scheduled MRI appointment.  Continue other medications as prescribed.  Follow up with primary care provider or the neurology specialist.

## 2025-01-02 ENCOUNTER — Inpatient Hospital Stay
Admission: RE | Admit: 2025-01-02 | Discharge: 2025-01-02 | Attending: Physician Assistant | Admitting: Physician Assistant

## 2025-01-02 DIAGNOSIS — M5010 Cervical disc disorder with radiculopathy, unspecified cervical region: Secondary | ICD-10-CM

## 2025-01-05 ENCOUNTER — Emergency Department
Admission: EM | Admit: 2025-01-05 | Discharge: 2025-01-05 | Disposition: A | Discharge status: Home / Self Care | Attending: Emergency Medicine | Admitting: Emergency Medicine

## 2025-01-05 ENCOUNTER — Other Ambulatory Visit: Payer: Self-pay

## 2025-01-05 DIAGNOSIS — Z760 Encounter for issue of repeat prescription: Secondary | ICD-10-CM | POA: Insufficient documentation

## 2025-01-05 MED ORDER — HYDROCODONE-ACETAMINOPHEN 5-325 MG PO TABS: 1.0000 | ORAL_TABLET | Freq: Four times a day (QID) | ORAL | 0 refills | Status: AC | PRN

## 2025-01-05 NOTE — ED Triage Notes (Signed)
 Pt to ED for med refill for Vicoden. States has been seen here several times for L shoulder and arm pain from pinched nerve and is pending MRI results from neurologist.

## 2025-01-05 NOTE — ED Notes (Addendum)
 See triage note  Presents requesting pain med refill  States she had her MRI last Thursday  Awaiting results  Denies any additional  injury

## 2025-01-05 NOTE — Discharge Instructions (Signed)
 Call the neurosurgery office for continued pain medication and also the results of your MRI.

## 2025-01-05 NOTE — ED Provider Notes (Signed)
 "  [facility] Provider Note    Event Date/Time   First MD Initiated Contact with Patient 01/05/25 [phone]     (approximate)   History   Medication Refill   HPI  [name] is a 59 y.o. female presents to the ED for a medication refill.  Patient states that she had an MRI on 01/02/2025 but has not heard the results of her test.  Patient has been taking Vicodin for her pain.  She is requesting a refill until she can be seen by her neurosurgeon.     Physical Exam   Triage Vital Signs: ED Triage Vitals  Encounter Vitals Group     BP 01/05/25 0826 (!) 145/93     Girls Systolic BP Percentile --      Girls Diastolic BP Percentile --      Boys Systolic BP Percentile --      Boys Diastolic BP Percentile --      Pulse Rate 01/05/25 0826 95     Resp 01/05/25 0826 16     Temp 01/05/25 0826 98 F (36.7 C)     Temp Source 01/05/25 0826 Oral     SpO2 01/05/25 0826 98 %     Weight 01/05/25 0825 229 lb (103.9 kg)     Height 01/05/25 0825 5' 4 (1.626 m)     Head Circumference --      Peak Flow --      Pain Score 01/05/25 0825 10     Pain Loc --      Pain Education --      Exclude from Growth Chart --     Most recent vital signs: Vitals:   01/05/25 0826  BP: (!) 145/93  Pulse: 95  Resp: 16  Temp: 98 F (36.7 C)  SpO2: 98%     General: Awake, no distress.  CV:  Good peripheral perfusion.  Resp:  Normal effort.  Abd:  No distention.  Other:     ED Results / Procedures / Treatments   Labs (all labs ordered are listed, but only abnormal results are displayed) Labs Reviewed - No data to display   PROCEDURES:  Critical Care performed:   Procedures   MEDICATIONS ORDERED IN ED: Medications - No data to display   IMPRESSION / MDM / ASSESSMENT AND PLAN / ED COURSE  I reviewed the triage vital signs and the nursing notes.   Differential diagnosis includes, but is not limited to, medication refill  Patient presents to the ED for  medication refill for Vicodin.  She was last seen in the emergency department on 12/27/2024 which was prior to her MRI appointment.  Patient has been unable to see the results of her test on MyChart and I am unable to pull this up as it was done at a facility outside of [facility].  A prescription for hydrocodone  was sent to the pharmacy and she is strongly encouraged to call on Monday morning to her neurosurgeon to get the results of her test.  She also notes that she should be getting any further pain medication from her neurosurgeon.      Patient's presentation is most consistent with acute, uncomplicated illness.  FINAL CLINICAL IMPRESSION(S) / ED DIAGNOSES   Final diagnoses:  Encounter for medication refill     Rx / DC Orders   ED Discharge Orders          Ordered    HYDROcodone -acetaminophen  (NORCO/VICODIN) 5-325 MG tablet  Every 6  hours PRN        01/05/25 9071             Note:  This document was prepared using Dragon voice recognition software and may include unintentional dictation errors.   Saunders Shona CROME, PA-C 01/05/25 1229    Jacolyn Pae, MD 01/05/25 1521  "

## 2025-01-06 ENCOUNTER — Telehealth: Payer: Self-pay | Admitting: Physician Assistant

## 2025-01-06 NOTE — Telephone Encounter (Addendum)
 The patient was seen in the ER yesterday for cervical pain and is requesting to be evaluated by Lyle as soon as possible. Can the radiology report be obtained and reviewed to assist with scheduling?

## 2025-01-07 NOTE — Telephone Encounter (Signed)
"  Report finalized.  "

## 2025-01-14 ENCOUNTER — Ambulatory Visit: Admitting: Physician Assistant

## 2025-01-15 ENCOUNTER — Ambulatory Visit: Admitting: Physician Assistant

## 2025-01-15 DIAGNOSIS — M4802 Spinal stenosis, cervical region: Secondary | ICD-10-CM | POA: Diagnosis not present

## 2025-01-15 DIAGNOSIS — M47812 Spondylosis without myelopathy or radiculopathy, cervical region: Secondary | ICD-10-CM | POA: Diagnosis not present

## 2025-01-15 DIAGNOSIS — M5010 Cervical disc disorder with radiculopathy, unspecified cervical region: Secondary | ICD-10-CM

## 2025-01-15 NOTE — Progress Notes (Signed)
 Spoke with patient regarding details of cervical MRI results below.  She continues to be in severe pain and is went to the emergency department twice since she was last seen in clinic secondary to her pain.  She still feels as though she has left arm weakness.  She has been on multiple rounds of steroids without relief.  They increased her gabapentin  to 300 mg 3 times a day for the past week, but she is still in significant amount of pain.  Will plan to increase this to 600 mg 3 times a day to try to help her get her pain under better control.  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  Plan for her to see Dr. Claudene in clinic next week to discuss this further.         MRI CERVICAL SPINE WITHOUT CONTRAST 01/02/2025 02:55:53 PM   TECHNIQUE: Multiplanar multisequence MRI of the cervical spine was performed.   COMPARISON: Cervical spine radiograph 12/23/2024 and CT cervical spine 12/17/2024.   CLINICAL HISTORY: Neck pain, chronic, degenerative changes on xray; L cervical radiculopathy.   FINDINGS:   BONES AND ALIGNMENT: There is straightening and slight reversal of the normal cervical lordosis. Normal vertebral body heights. Signal abnormality in the T1 vertebral body suggestive of a hemangioma.   SPINAL CORD: Normal spinal cord size. No abnormal spinal cord signal. Ventral flattening of the cervical cord is noted at C3-C4, C4-C5, C5-C6, and C6-C7.   SOFT TISSUES: No paraspinal mass.   C2-C3: Small disc bulge. No significant spinal canal or foraminal stenosis. Facet arthrosis on the left.   C3-C4: Disc bulge eccentric to the right which indents the ventral thecal sac with mild flattening of the ventral cervical cord more pronounced on the right. No cord signal abnormality. Mild spinal canal stenosis. Mild thickening of the ligamentum flavum. Bilateral facet arthrosis slightly greater on the left.  Mild left foraminal stenosis.   C4-C5: Disc bulge which indents the ventral thecal sac with flattening of the ventral cervical cord. Thickening of the ligamentum flavum. Mild spinal canal stenosis. Bilateral facet arthrosis slightly greater on the left. No significant foraminal stenosis.   C5-C6: Disc desiccation and mild disc height loss. Disc osteophyte complex which indents the ventral thecal sac with flattening of the ventral cervical cord. Thickening of the ligamentum flavum. Mild spinal canal stenosis. Bilateral facet arthrosis and uncovertebral hypertrophy. Mild bilateral foraminal stenosis.   C6-C7: Disc desiccation and mild disc height loss. Disc osteophyte complex which indents the ventral thecal sac with mild flattening of the ventral cervical cord. Mild thickening of the ligamentum flavum. No significant spinal canal stenosis. Bilateral facet arthrosis and uncovertebral hypertrophy. Mild right and moderate left foraminal stenosis. Neural cyst noted on the right.   C7-T1: Small disc bulge. No significant spinal canal stenosis. Bilateral facet arthrosis slightly greater on the left. No significant foraminal stenosis.   IMPRESSION: 1. Multilevel cervical spondylosis as above. 2. 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 3. Mild spinal canal stenosis at C3-4 through C5-6. Mild ventral cord flattening without cord signal abnormality. 4. Straightening and slight reversal of the normal cervical lordosis.     This visit was performed via telephone.  Patient location: home Provider location: office  I spent a total of 10 minutes non-face-to-face activities for this visit on the date of this encounter including review of current clinical condition and response to treatment.  The patient  is aware of and accepts the limits of this telehealth visit.

## 2025-01-20 ENCOUNTER — Telehealth: Admitting: Neurosurgery

## 2025-01-20 DIAGNOSIS — M5010 Cervical disc disorder with radiculopathy, unspecified cervical region: Secondary | ICD-10-CM

## 2025-01-21 ENCOUNTER — Telehealth: Admitting: Neurosurgery

## 2025-01-21 DIAGNOSIS — M5412 Radiculopathy, cervical region: Secondary | ICD-10-CM

## 2025-01-21 DIAGNOSIS — M5010 Cervical disc disorder with radiculopathy, unspecified cervical region: Secondary | ICD-10-CM | POA: Insufficient documentation

## 2025-01-21 DIAGNOSIS — M4802 Spinal stenosis, cervical region: Secondary | ICD-10-CM

## 2025-01-21 NOTE — Progress Notes (Signed)
 I had a follow-up video visit today with Ms. [name].  She was at home and I was in the office.  She gave consent to go forward with a video visit.  We discussed her left-sided cervical radiculopathy.  She had a recent MRI which demonstrated foraminal stenosis most severe at C6-7 on the left but also present at multiple other locations.  She does feel like her weakness is getting worse.  She has had multiple ER visits to control her pain.  She has not yet been able to start physical therapy.  Will plan on having her come into neurosurgery clinic for evaluation.  I gave her a list of physical therapy locations.  We did discuss that if her weakness is getting worse or becoming more severe that we may need to discuss a surgical decompression to help the nerves have a chance to recover.  I will plan on having my team reach out to her for scheduling.  Penne MICAEL Sharps, MD  Spent a total of 10 minutes on the video visit.  We reviewed her images via screen share, answered questions, and coordinate her care going forward.

## 2025-01-21 NOTE — Progress Notes (Signed)
 Patient did not present for the video visit.  Will plan to reschedule

## 2025-01-21 NOTE — Patient Instructions (Signed)
 LOCAL PHYSICAL THERAPY  [facility] Physical Therapy  [address]  [place], [place] [address]  [phone]   [facility]  [address] [place], [place] [address]  [phone]   [facility] (2 locations)  [address]  [place], [place] [address]  [phone]          or  [address]  [place], [place] [address]  [phone]   [facility] Physical Therapy  [address]  [place], [place] [address]  [phone]  **dry needling**   [facility] ([facility])  [address]  [place], [place] [address]  [phone]  Fax: [phone]  ** Aquatic therapy[address] [place], [place] [address] [phone] **Aquatic therapy**  [facility] [address] [place], [place] [address] [phone]  [facility]  [address]  [place], [place] [address]  [phone]   [facility]  [address]  [place], [place] [address]  [phone]   [facility]  [address]   Deland NOVAK  [place], [place] [address]  [phone]   [facility]  [address]  [place], [place] [address]  [phone]  **dry needling**   PELVIC FLOOR/SI JOINT  [facility]  Nidia Laud, PT  [email or web address]   [place]   [facility]  [address]  [address] [place], [place] [address]  [phone]   [facility] [address] [place], [place] [address] [phone]  [facility] Therapy [address] 2nd floor [place], [place] [address] [phone]  [facility] Therapy [address] [place], [place] [address] [phone]  [facility] Rehabilitation [address] [place], [place]  [address] [phone]  [facility]  [address]  [place], [place] [address]  [phone]  [facility] [address] [place], [place] [address] [phone]  [facility]  [address] [place], [place] [address] [phone]   [facility] Physical Therapy & Aquatic Therapy [address] [place], [place] [address] [phone]  [facility] [place]  [address] [place] [address]  [phone]   [facility] [address] [address] [place], [place] [address]  [facility] [phone] [place], [place] [address] [phone]  [facility] [address] [place], [place] [address] [phone]  [facility] [address] [place], [place] [address]  [facility] [address] [place], [place] [address] [phone]  [facility] Physical Therapy [address], [place] [address] [phone]  [facility] [address]  [place], [place] [phone]  [facility] [address] [place], [place] [address] [phone]  [facility] Physical Therapy & wellness [address] [place], [place] [address] [phone]  [facility] [address] [place] [address] [phone]  [facility], [place]   [facility]  Channing Dross, PT  [address]  [place], [place] [address] [phone]   [name]   [facility]  [address]  [phone]   [facility]   Deep [address] Physical Therapy  [address]  [phone]   CHIROPRACTORS   [facility]  [facility]  [address]  [place], [place] [address]  [phone]  **No Aetna or medicaid**   [facility]  [phone] [address], [place] [address]  [phone]   [facility]  [address]  [place], [place] [address]  [phone]   Todd Asa, DC  [address], [place] [address]  [phone]   [facility]  [address] [place] [address]  [phone]   [facility]  [address]  [place], [place] [address]  [phone]  [facility]  (3 locations)  [address]  [place], [place] [address]  [phone]  **dry needling**           or  [address] [place], [place] [address]  [phone]  **Additionally has Deland Tedra Ip, OT**           or  [address]  [place], [place] [address]  [phone]  **Pediatric therapy**  [facility]  [address]  [phone]  **dry needlingTHORA Geradine Pander Physical Therapy  [address]  [place], [place] [address]  [phone]  [facility]   (Inside [address] Fitness)  [address]  [place], [place] [address]  [phone]  **dry needling**  **MEDICAID or UNINSURED** The [facility] dept. Of Physical Therapy [place], [place] [address] [phone] [phone] (They will put patient's on a waiting list and will take patient's that have insurance but can't afford Co-Pay)  [facility]  [address] [place], [place] [address]  [phone]   [facility] Physical Therapy  [address]  [place], [place] [address]  [phone]   [facility] Physical Therapy  [address] [place], [place] [address]  [phone]   [facility] Rehabilitation  [address]  [place], [place] [address]  [phone]   [facility] Physical Therapy  [address] [place], [place] [address]  [phone]   [facility]  [address]  [place], [place] [address]  [phone]   [facility] Specialists [address] [place], [place] [address]  AQUATIC   [employer]  [facility]  [place]   [place]  *Residents only*  [employer] at [employer]  *Residents only*   [facility]  Exercise class  [facility]  Exercise class    [place], [place]  [facility]  [address]  [phone]   [facility]  [facility]  [address]  [phone]           or  [address]  [phone]   [facility] Arthritis Support Group   Provides education and support and practical information for coping with arthritis for arthritis sufferers and their families.   When: 12:15 - 1:30 p.m. the second Monday of each month, March through December  Info: Call Rehabilitation Services at [phone]

## 2025-01-22 NOTE — Progress Notes (Signed)
 Patient scheduled in [place] and added to wait list is someone cancels.

## 2025-02-17 ENCOUNTER — Ambulatory Visit
